# Patient Record
Sex: Female | Born: 1940
Health system: Southern US, Community
[De-identification: ages and names within clinical notes are randomized; demographics above are authoritative.]

## PROBLEM LIST (undated history)

## (undated) DIAGNOSIS — Z87442 Personal history of urinary calculi: Secondary | ICD-10-CM

## (undated) DIAGNOSIS — J189 Pneumonia, unspecified organism: Secondary | ICD-10-CM

## (undated) DIAGNOSIS — M1990 Unspecified osteoarthritis, unspecified site: Secondary | ICD-10-CM

## (undated) DIAGNOSIS — I10 Essential (primary) hypertension: Secondary | ICD-10-CM

## (undated) HISTORY — PX: BACK SURGERY: SHX140

## (undated) HISTORY — PX: BREAST BIOPSY: SHX20

## (undated) HISTORY — PX: ABDOMINAL HYSTERECTOMY: SHX81

## (undated) HISTORY — PX: COLONOSCOPY: SHX174

## (undated) HISTORY — PX: CHOLECYSTECTOMY: SHX55

## (undated) HISTORY — PX: TONSILLECTOMY: SUR1361

---

## 1998-09-13 ENCOUNTER — Inpatient Hospital Stay (HOSPITAL_COMMUNITY): Admission: EM | Admit: 1998-09-13 | Discharge: 1998-09-16 | Payer: Self-pay | Admitting: Emergency Medicine

## 1998-09-13 ENCOUNTER — Encounter: Payer: Self-pay | Admitting: Emergency Medicine

## 1998-09-15 ENCOUNTER — Encounter: Payer: Self-pay | Admitting: Internal Medicine

## 1999-09-28 ENCOUNTER — Other Ambulatory Visit: Admission: RE | Admit: 1999-09-28 | Discharge: 1999-09-28 | Payer: Self-pay | Admitting: Family Medicine

## 2000-05-22 ENCOUNTER — Encounter: Payer: Self-pay | Admitting: Family Medicine

## 2000-05-22 ENCOUNTER — Encounter: Admission: RE | Admit: 2000-05-22 | Discharge: 2000-05-22 | Payer: Self-pay | Admitting: Family Medicine

## 2000-11-04 ENCOUNTER — Encounter: Payer: Self-pay | Admitting: Emergency Medicine

## 2000-11-04 ENCOUNTER — Emergency Department (HOSPITAL_COMMUNITY): Admission: EM | Admit: 2000-11-04 | Discharge: 2000-11-05 | Payer: Self-pay | Admitting: Emergency Medicine

## 2000-11-06 ENCOUNTER — Encounter: Payer: Self-pay | Admitting: Urology

## 2000-11-06 ENCOUNTER — Ambulatory Visit (HOSPITAL_COMMUNITY): Admission: RE | Admit: 2000-11-06 | Discharge: 2000-11-06 | Payer: Self-pay | Admitting: Urology

## 2000-11-12 ENCOUNTER — Emergency Department (HOSPITAL_COMMUNITY): Admission: EM | Admit: 2000-11-12 | Discharge: 2000-11-12 | Payer: Self-pay | Admitting: Emergency Medicine

## 2000-12-12 ENCOUNTER — Encounter: Payer: Self-pay | Admitting: Urology

## 2000-12-14 ENCOUNTER — Encounter: Payer: Self-pay | Admitting: Urology

## 2000-12-14 ENCOUNTER — Ambulatory Visit (HOSPITAL_COMMUNITY): Admission: RE | Admit: 2000-12-14 | Discharge: 2000-12-14 | Payer: Self-pay | Admitting: Urology

## 2001-01-22 ENCOUNTER — Other Ambulatory Visit: Admission: RE | Admit: 2001-01-22 | Discharge: 2001-01-22 | Payer: Self-pay | Admitting: Family Medicine

## 2001-01-23 ENCOUNTER — Encounter: Payer: Self-pay | Admitting: Urology

## 2001-01-23 ENCOUNTER — Ambulatory Visit (HOSPITAL_COMMUNITY): Admission: RE | Admit: 2001-01-23 | Discharge: 2001-01-23 | Payer: Self-pay | Admitting: Urology

## 2001-02-09 ENCOUNTER — Encounter (INDEPENDENT_AMBULATORY_CARE_PROVIDER_SITE_OTHER): Payer: Self-pay

## 2001-02-09 ENCOUNTER — Other Ambulatory Visit: Admission: RE | Admit: 2001-02-09 | Discharge: 2001-02-09 | Payer: Self-pay | Admitting: Obstetrics and Gynecology

## 2001-06-13 ENCOUNTER — Encounter: Admission: RE | Admit: 2001-06-13 | Discharge: 2001-06-13 | Payer: Self-pay | Admitting: Family Medicine

## 2001-06-13 ENCOUNTER — Encounter: Payer: Self-pay | Admitting: Family Medicine

## 2001-12-02 ENCOUNTER — Encounter (INDEPENDENT_AMBULATORY_CARE_PROVIDER_SITE_OTHER): Payer: Self-pay | Admitting: Specialist

## 2001-12-02 ENCOUNTER — Inpatient Hospital Stay (HOSPITAL_COMMUNITY): Admission: EM | Admit: 2001-12-02 | Discharge: 2001-12-08 | Payer: Self-pay | Admitting: Emergency Medicine

## 2001-12-03 ENCOUNTER — Encounter: Payer: Self-pay | Admitting: Surgery

## 2001-12-05 ENCOUNTER — Encounter: Payer: Self-pay | Admitting: Surgery

## 2002-02-07 ENCOUNTER — Ambulatory Visit (HOSPITAL_COMMUNITY): Admission: RE | Admit: 2002-02-07 | Discharge: 2002-02-07 | Payer: Self-pay | Admitting: Surgery

## 2002-02-07 ENCOUNTER — Encounter: Payer: Self-pay | Admitting: Surgery

## 2002-03-05 ENCOUNTER — Other Ambulatory Visit: Admission: RE | Admit: 2002-03-05 | Discharge: 2002-03-05 | Payer: Self-pay | Admitting: Obstetrics and Gynecology

## 2002-04-24 ENCOUNTER — Encounter: Admission: RE | Admit: 2002-04-24 | Discharge: 2002-04-24 | Payer: Self-pay | Admitting: Obstetrics and Gynecology

## 2002-04-24 ENCOUNTER — Encounter: Payer: Self-pay | Admitting: Obstetrics and Gynecology

## 2003-03-11 ENCOUNTER — Other Ambulatory Visit: Admission: RE | Admit: 2003-03-11 | Discharge: 2003-03-11 | Payer: Self-pay | Admitting: Obstetrics and Gynecology

## 2003-05-01 ENCOUNTER — Encounter: Admission: RE | Admit: 2003-05-01 | Discharge: 2003-05-01 | Payer: Self-pay | Admitting: Obstetrics and Gynecology

## 2003-05-01 ENCOUNTER — Encounter: Payer: Self-pay | Admitting: Obstetrics and Gynecology

## 2003-05-28 ENCOUNTER — Encounter (INDEPENDENT_AMBULATORY_CARE_PROVIDER_SITE_OTHER): Payer: Self-pay | Admitting: Specialist

## 2003-05-28 ENCOUNTER — Ambulatory Visit (HOSPITAL_COMMUNITY): Admission: RE | Admit: 2003-05-28 | Discharge: 2003-05-28 | Payer: Self-pay | Admitting: Unknown Physician Specialty

## 2003-07-09 ENCOUNTER — Other Ambulatory Visit: Admission: RE | Admit: 2003-07-09 | Discharge: 2003-07-09 | Payer: Self-pay | Admitting: Obstetrics and Gynecology

## 2004-07-12 ENCOUNTER — Other Ambulatory Visit
Admission: RE | Admit: 2004-07-12 | Discharge: 2004-07-12 | Payer: Self-pay | Admitting: Physical Medicine & Rehabilitation

## 2004-07-30 ENCOUNTER — Encounter: Admission: RE | Admit: 2004-07-30 | Discharge: 2004-07-30 | Payer: Self-pay | Admitting: Family Medicine

## 2004-08-09 ENCOUNTER — Other Ambulatory Visit: Admission: RE | Admit: 2004-08-09 | Discharge: 2004-08-09 | Payer: Self-pay | Admitting: Obstetrics and Gynecology

## 2004-09-21 ENCOUNTER — Observation Stay (HOSPITAL_COMMUNITY): Admission: RE | Admit: 2004-09-21 | Discharge: 2004-09-22 | Payer: Self-pay | Admitting: Obstetrics and Gynecology

## 2004-09-21 ENCOUNTER — Encounter (INDEPENDENT_AMBULATORY_CARE_PROVIDER_SITE_OTHER): Payer: Self-pay | Admitting: *Deleted

## 2005-04-13 ENCOUNTER — Encounter: Admission: RE | Admit: 2005-04-13 | Discharge: 2005-04-13 | Payer: Self-pay | Admitting: *Deleted

## 2005-08-22 ENCOUNTER — Encounter: Admission: RE | Admit: 2005-08-22 | Discharge: 2005-08-22 | Payer: Self-pay | Admitting: Obstetrics and Gynecology

## 2005-08-31 ENCOUNTER — Other Ambulatory Visit: Admission: RE | Admit: 2005-08-31 | Discharge: 2005-08-31 | Payer: Self-pay | Admitting: Obstetrics and Gynecology

## 2005-09-26 ENCOUNTER — Emergency Department (HOSPITAL_COMMUNITY): Admission: EM | Admit: 2005-09-26 | Discharge: 2005-09-26 | Payer: Self-pay | Admitting: Family Medicine

## 2005-10-17 ENCOUNTER — Encounter: Admission: RE | Admit: 2005-10-17 | Discharge: 2005-10-17 | Payer: Self-pay | Admitting: Family Medicine

## 2006-08-29 ENCOUNTER — Encounter: Admission: RE | Admit: 2006-08-29 | Discharge: 2006-08-29 | Payer: Self-pay | Admitting: Family Medicine

## 2006-10-18 ENCOUNTER — Encounter: Admission: RE | Admit: 2006-10-18 | Discharge: 2006-10-18 | Payer: Self-pay | Admitting: Family Medicine

## 2007-01-16 ENCOUNTER — Ambulatory Visit (HOSPITAL_BASED_OUTPATIENT_CLINIC_OR_DEPARTMENT_OTHER): Admission: RE | Admit: 2007-01-16 | Discharge: 2007-01-16 | Payer: Self-pay | Admitting: Orthopedic Surgery

## 2007-09-07 ENCOUNTER — Encounter: Admission: RE | Admit: 2007-09-07 | Discharge: 2007-09-07 | Payer: Self-pay | Admitting: Surgical Oncology

## 2008-10-02 ENCOUNTER — Encounter: Admission: RE | Admit: 2008-10-02 | Discharge: 2008-10-02 | Payer: Self-pay | Admitting: Family Medicine

## 2009-11-02 ENCOUNTER — Encounter: Admission: RE | Admit: 2009-11-02 | Discharge: 2009-11-02 | Payer: Self-pay | Admitting: Family Medicine

## 2010-11-30 ENCOUNTER — Other Ambulatory Visit: Payer: Self-pay | Admitting: Family Medicine

## 2010-11-30 DIAGNOSIS — Z1231 Encounter for screening mammogram for malignant neoplasm of breast: Secondary | ICD-10-CM

## 2010-12-01 ENCOUNTER — Ambulatory Visit
Admission: RE | Admit: 2010-12-01 | Discharge: 2010-12-01 | Disposition: A | Discharge status: Home / Self Care | Payer: Medicare Other | Source: Ambulatory Visit | Attending: Family Medicine | Admitting: Family Medicine

## 2010-12-01 DIAGNOSIS — Z1231 Encounter for screening mammogram for malignant neoplasm of breast: Secondary | ICD-10-CM

## 2010-12-02 ENCOUNTER — Other Ambulatory Visit: Payer: Self-pay | Admitting: Family Medicine

## 2010-12-02 DIAGNOSIS — R928 Other abnormal and inconclusive findings on diagnostic imaging of breast: Secondary | ICD-10-CM

## 2010-12-07 ENCOUNTER — Ambulatory Visit
Admission: RE | Admit: 2010-12-07 | Discharge: 2010-12-07 | Disposition: A | Discharge status: Home / Self Care | Payer: Medicare Other | Source: Ambulatory Visit | Attending: Family Medicine | Admitting: Family Medicine

## 2010-12-07 DIAGNOSIS — R928 Other abnormal and inconclusive findings on diagnostic imaging of breast: Secondary | ICD-10-CM

## 2010-12-14 NOTE — Op Note (Signed)
Ashley Lamb, Ashley Lamb                 ACCOUNT NO.:  [id number]   MEDICAL RECORD NO.:  [id number]          PATIENT TYPE:  AMB   LOCATION:  DSC                          FACILITY:  [facility]   PHYSICIAN:  Robert A. Thurston Hole, M.D. DATE OF BIRTH:  06-19-1941   DATE OF PROCEDURE:  01/16/2007  DATE OF DISCHARGE:                               OPERATIVE REPORT   PREOPERATIVE DIAGNOSIS:   PREOPERATIVE DIAGNOSES:  Right knee medial and lateral meniscal tears  with chondromalacia and synovitis.   POSTOPERATIVE DIAGNOSIS:  Right knee medial and lateral meniscal tears  with chondromalacia and synovitis.   PROCEDURE PERFORMED:  1. Right knee examination under anesthesia followed by arthroscopic      medial and lateral meniscectomies.  2. Chondroplasty with partial synovectomy.   SURGEON:  Elana Alm. Thurston Hole, M.D.   ASSISTANT SURGEON:  Julien Girt, P.A.   ANESTHESIA:  Local MAC.   OPERATIVE TIME:  30 minutes.   COMPLICATIONS:  None.   INDICATIONS FOR PROCEDURE:  Ashley Lamb is a 70 year old woman who has  had 3-4 months of increasing right knee pain with exam and MRI scan  documenting meniscal tearing with chondromalacia and synovitis.  She has  failed conservative care and is now to undergo arthroscopy.   DESCRIPTION OF PROCEDURE:  Ashley Lamb was brought into the operating  room on January 16, 2007 after a knee block was placed in the holding room  by anesthesia.  She was placed on the operative table in the supine  position.  Her tie knee was examined under anesthesia.  She had full  range of motion, 1 to 2+ crepitation, and a stable ligamentous exam,  with normal patellar tracking.  The right leg was prepped using sterile  DuraPrep and drapes using sterile technique.  Originally through an  anterolateral portal, the arthroscope with the pump test was placed  through an anteromedial portal and an arthroscopic probe was placed.   On initial inspection of the medial compartment, she was  found to have  75% grade III chondromalacia which was debrided.  A medial meniscus  tear, posterior and medial horn, was 25-35% was resected back to a  stable rim.  The intercondylar notch was inspected, anterior and  posterior cruciate ligaments were normal.  The compartment was  inspected, 30-40% grade III chondromalacia which was debrided.  Lateral  meniscus tear posterolateral and anterior horn, of which 30-40% was  resected back to a stable rim.  The patellofemoral joint showed 50-75%  grade III chondromalacia on the patellar and femoral groove and this was  debrided.  The patella tracked normally.  Significant synovitis in the  medial and lateral gutters was debrided, otherwise they were free of  pathology.  After this was done, it was felt all pathology had been  satisfactorily addressed.  The instruments were removed.  The portals  were closed with 3-0 nylon sutures.  Sterile dressings were applied, and  the patient was awakened and taken to the recovery room in stable  condition.   FOLLOW-UP:  Ashley Lamb will be followed as an outpatient on Percocet and  Mobic.  Will see her back in the office in a week for sutures out and  follow-up.      Robert A. Thurston Hole, M.D.  Electronically Signed     RAW/MEDQ  D:  01/16/2007  T:  01/17/2007  Job:  [id number]

## 2010-12-17 NOTE — H&P (Signed)
Ashley Lamb, MARCHANT NO.:  [id number]   MEDICAL RECORD NO.:  [id number]          PATIENT TYPE:  INP   LOCATION:  NA                            FACILITY:  [facility]   PHYSICIAN:  Artist Pais, M.D.    DATE OF BIRTH:  Dec 16, 1940   DATE OF ADMISSION:  DATE OF DISCHARGE:                                HISTORY & PHYSICAL   DATE OF SCHEDULED SURGERY:  September 21, 2004.   CURRENT HISTORY:  The patient is a 70 year old para 3 African-American  female who is admitted for a TAH/BSO due to an enlarging fibroid. She is  menopausal, underwent a pelvic ultrasound February 02, 2001 for complaints of  longer periods and to evaluate her endometrial thickness. She was found to  have an anterior fibroid within the uterine fundus measuring 1.7 cm, and  there was also an additional fibroid measuring 5.3 x 5.0 x 4.7 cm. Most  recently on pelvic ultrasound, her fibroid measures 7.0 x 5.2 x 5.4 cm so it  appears to have increased in size and should not be doing so, given the fact  that she is menopausal. This has been a gradual increase and certainly not  one that is so rapid to suspect a sarcoma; however, has been enlarging over  the last 3 years. The patient desires to undergo a TAH/BSO. She does not  wish to continue ultrasound surveillance to assess the growth of this  fibroid and desires TAH/BSO. The patient told me at her consultation visit  she would prefer to have surgery and I explained that we could watch this  carefully but the mean fibroid size has increased which should not really  occur in a patient who is 70 years old. She adamantly would prefer to have  surgery than to continue to have this watched, which I think is reasonable.  A follow-up ultrasound performed on March 31, 2001 showed the fibroid to  measure 5.8 x 4.4 x 5.0 cm and previously, July 2002 had measured 5.3 x 5.0  x 4.7 cm. On April 30, 2003 the fundal myoma measured 6.0 x 4.5 x 5 cm  with a 5.3 cm mean, and  because 5.8 and 6.0 cm were similar numbers, at that  point I attributed this to measurement variation. However, on the most  recent fibroid measurement, the fibroid was found to measure 7.0 x 5.2 x 5.4  cm which had clearly increased in size with a mean of 5.9 and a mean of 5.3  on September 2004. Risks of surgery including anesthetic complication;  hemorrhage; infection; damage to adjacent structures including bladder,  bowel, blood vessels, or ureters discussed with the patient. She was made  aware of postoperative risks to include wound infection, urinary tract  infection, atelectasis, pneumonia, DVT which could result in stroke or  pulmonary embolism which could be life-threatening. She is also made aware  of intraoperative risks which can include pelvic hemorrhage which could be  life-threatening. She was also made aware of postoperative risks to include  vesicovaginal fistula and the etiology of how this could occur was  discussed  with her. Most recently, she was found to have benign reactive changes over  the last two Pap smears with a history of ASCUS on March 11, 2003. Follow-  up Pap smear just showed benign reactive changes. Low-risk and high-risk HPV  were not detected. However, I decided to perform a colposcopy at which time  she was found to have a positive ECC. I did discuss this case in great  detail with Dr. Terie Purser who agreed that everything was low-grade on  the [facility] but because there were polypoid fragments in the cervical canal, I  decided it would be prudent to do a LEEP cone biopsy to rule out invasive  cancer. The LEEP cone was performed and she was found to have CIN-1 in the  canal and on the LEEP specimen there is no evidence of any high-grade  lesion. Thus, it appears safe to appear with her TAH/BSO. We also have  received clearance from her primary care physician who is Dr. Lupe Carney,  [place] [facility].   OBSTETRICAL/GYNECOLOGICAL HISTORY:  The  patient has a history of a tubal  ligation.   PAST MEDICAL HISTORY:  1.  Hypertension.  2.  Hypercholesterolemia.  3.  History of a kidney stone.  4.  Cholecystitis.  5.  History of postmenopausal bleeding with a benign endometrial polyp noted      on D&C hysteroscopy.   ALLERGIES:  No known drug allergies.   CURRENT MEDICATIONS:  1.  Toprol-XL 100 mg daily.  2.  Prempro 0.625/5 mg - the patient was urged to discontinue this on      September 15, 2004.  3.  She was also told not to take any further ibuprofen and her last      ibuprofen dose was September 14, 2004 and for this reason we will obtain      a bleeding time today on September 20, 2004.  4.  She takes potassium chloride 10 mEq.  5.  Hydrochlorothiazide 25 mg daily.  6.  Norvasc 5 mg daily.   The patient denies using any herbs.   SURGERIES:  1.  Cholecystectomy.  2.  D&C.  3.  Lithotripsy.  4.  Tubal ligation.   FAMILY HISTORY:  There is no family history of colon, breast, ovarian, or  prostate cancer. The patient's mother died at 66 with hypertension, heart  disease, and diabetes. Father died at 43 of hypertension and heart disease.  She has four siblings - one of whom has hypertension and one has diabetes;  otherwise, alive and well. Three children in their 30s, all alive and well.   It should be noted that the patient originally was referred for  postmenopausal bleeding which was completely worked up.   SOCIAL HISTORY:  The patient is retired from [employer].  Tobacco:  None. Alcohol:  None.   REVIEW OF SYSTEMS:  Noncontributory except as noted above. Denies headache,  visual changes, chest pain, shortness of breath, abdominal pain, change in  bowel habits, unintentional weight loss, dysuria, urgency, frequency,  vaginal pruritus or discharge, pain or bleeding with intercourse.   PHYSICAL EXAMINATION:  GENERAL APPEARANCE:  Well-developed African-American female.  VITAL SIGNS:  Blood pressure  146/86, heart rate 72, weight 215.  HEENT:  Normal.  NECK:  Supple without thyromegaly, adenopathy, or nodules.  CHEST:  Clear to auscultation.  BREASTS:  Symmetrical without masses, nodes, nipple retraction, or nipple  discharge. Breasts are dense in the upper outer quadrants but they  are  symmetrical.  CARDIAC:  Regular rate and rhythm without extra sounds or murmurs.  ABDOMEN:  Soft, nontender.  No hepatosplenomegaly or masses.  EXTREMITIES:  No cyanosis, clubbing, or edema.  NEUROLOGIC:  Oriented x3, grossly normal.  PELVIC:  Normal external female genitalia. No visual vulvar, vaginal, or  cervical lesions. Pap smear was performed as indicated above on July 12, 2004 and found to have benign reactive changes for which the patient has  undergone a complete workup as well as a LEEP cone to rule out carcinoma in  the endocervical canal. Everything has come back as low grade. Bimanual  examination reveals the uterus to be approximately 6-8 weeks, retroverted,  and nontender, without any adnexal masses palpated. I was able to palpate  her fibroid which palpated to be approximately 7 cm.  RECTAL:  Excellent sphincter tone, confirms pelvic exam. No masses palpated.   LABORATORY DATA:  Preoperative labs pending including a CBC, bleeding time,  PT, PTT, CMET, and urinalysis.   The patient does know that she will have compression boots to decrease the  risk of a DVT and she will also be instructed in incentive spirometry.  Preoperative antibiotics were ordered. The patient does not require SBE  prophylaxis. As mentioned above, we did obtain surgical clearance from Dr.  Lupe Carney. The patient does have  a vertical, well-healed abdominal  incision which was her tubal ligation and I have made her aware that because  of this she will need to do a bowel prep because I am concerned about  possible adhesive disease. We, however, do plan to do a Pfannenstiel  incision if it appears on  evaluation that I can adequately remove the uterus  without any difficulty with a Pfannenstiel incision. Otherwise, this would  be a vertical incision. The patient expresses understanding of and  acceptance of the risks and desires to proceed. I did see her for her  preoperative visit and sent a note with her to the hospital and I did give  her instructions for bowel prep.      DC/MEDQ  D:  09/20/2004  T:  09/20/2004  Job:  [id number]

## 2010-12-17 NOTE — Op Note (Signed)
[name], [name]:  [id number]   MEDICAL RECORD NO.:  [id number]          PATIENT TYPE:  INP   LOCATION:  9399                          FACILITY:  [facility]   PHYSICIAN:  Artist Pais, M.D.    DATE OF BIRTH:  1941-02-11   DATE OF PROCEDURE:  09/21/2004  DATE OF DISCHARGE:                                 OPERATIVE REPORT   PREOPERATIVE DIAGNOSIS:  Enlarging uterine fibroid in a postmenopausal  female.   POSTOPERATIVE DIAGNOSIS:  Enlarging uterine fibroid in a postmenopausal  female.  Adherent right ovary.   PROCEDURE:  Total abdominal hysterectomy, bilateral salpingo-oophorectomy.   SURGEON:  Artist Pais, M.D.   ASSISTANT:  Bing Neighbors. Sydnee Cabal, M.D.   ANESTHESIA:  General endotracheal.   DRAINS:  Foley.   COMPLICATIONS:  None.   SPECIMENS:  Uterus, tubes, ovaries, and cervix.   ESTIMATED BLOOD LOSS:  150 mL.   FLUIDS REPLACED:  3000 mL of crystalloid. Preoperatively, the patient did  receive preoperative Cefazolin as her preoperative antibiotic.   URINE OUTPUT:  350 mL.   FINDINGS:  Fundal enlarged fibroid approximately 7 cm, normal tubes and  ovaries, however the right ovary was very adherent to the right pelvic side  wall.   DESCRIPTION OF PROCEDURE:  The patient was brought to the operating room and  identified on the operating table.  After induction of adequate general  endotracheal anesthesia, the patient was placed in the supine position and  prepped and draped in the usual sterile fashion.  An examination under  anesthesia revealed the uterus and fibroid to be approximately 12 weeks in  total size and easily able to be removed via Pfannenstiel incision which had  been previously discussed with the patient.  She had a known vertical  incision from a tubal ligation, but a Pfannenstiel incision is stronger and  therefore the decision was made to perform this via the Pfannenstiel route.  Foley catheter was placed.  After a sterile prep  and drape, a Pfannenstiel  incision was made and carried down to the fascia.  The fascia was scored in  the midline and extended bilaterally using Mayo scissors.  It was then  separated free from the underlying muscles.  The muscles were separated in  the midline down to the symphysis.  The patient was placed in Trendelenburg  and the peritoneum was entered bluntly taking care to avoid bowel or other  abdominal contents.  The peritoneal incision was extended superiorly and  then inferiorly down to the bladder edge.  The pelvic and abdominal cavities  were explored.  There was noted to be an approximately 7 cm fundal fibroid  and the uterus was noted to be freely mobile.  There were noted to be  adhesions of the bowel to the left tube and ovary.  The right ovary was  noted to be significantly adherent to the right pelvic side wall.  Subsequently the O'Connor-O'Sullivan retractor was placed.  The bladder  blade was placed.  The bowel was packed out of the operative field and the  abdominal wall retractor was placed.  Long Kellys were placed at either  angle of the uterine incision and this was elevated to the level of the skin  incision.  The right round ligament was identified, suture ligated with a  figure-of-eight suture of 0 Monocryl, and divided using cautery.  The  anterior leaf of the broad ligament was separated over to the area overlying  the internal os.  The clear space was developed.  I was unable to adequately  see the ovary, so the decision was made to go ahead and remove the uterus  and then do the salpingo-oophorectomy.  The clear space was developed and a  Heaney clamp was placed across the utero-ovarian ligament, cut, and tied  with a free tie and then a suture ligature of 0 Monocryl.  The vessels were  skeletonized on the right.  The bladder was taken down using both sharp and  blunt dissection.  In a similar fashion, the left tube was identified,  clamped with a Kelly  clamp, and ligated using a figure-of-eight suture of 0  Vicryl as Dr. Sydnee Cabal prefers 0 Vicryl sutures.  The round ligament was  divided and the anterior leaf of the broad ligament was divided to the area  overlying the internal os.  This clear space was developed.  The bowel which  was really more bowel fat adherent to the left tube and ovary were taken  down very carefully using Metzenbaum scissors taking care to avoid any  damage to the bowel fat.  Subsequently, the infundibulopelvic ligament on  the left was clamped with a curved Heaney, cut, tied with a free tie and  then a suture ligature of 0 Vicryl.  The vessels were skeletonized on the  left.  The bladder was noted to be well down using both sharp and blunt  dissection.  The uterine vessels on the right were clamped with a curved  Heaney, cut, suture ligated with a suture ligature of 0 Monocryl in a figure-  of-eight fashion.  This was accomplished on the left in a similar fashion.  Successive cardinal uterosacral ligament bites were taken using a straight  Heaney clamp, cut, and suture ligated using sutures of either 0 Monocryl or  0 Vicryl.  Subsequently a curved Heaney was placed across the left  uterosacral ligament inferior to the cervix, cut, and the vagina was  entered.  A figure-of-eight suture of 0 Vicryl was placed.  This was  accomplished on the right in a similar fashion and a 0 Monocryl figure-of-  eight suture was placed.  Subsequently, using Satinsky scissors, the cervix  was separated from the vagina.  It should be noted that the uterus and  fibroid were amputated after the first cardinal uterosacral ligament bite  was taken to allow better visualization.  Subsequently, the cuff was closed  with figure-of-eight sutures of 0 Monocryl.  Attention was subsequently  turned to the right ovary which was quite adherent to the right pelvic side  wall.  The round ligament was identified and the ureter on the left had  been previously identified.  On the right, the round ligament was identified and  the posterior leaf of the broad ligament was opened and dissected.  The  right ovary was dissected off the pelvic side wall well away from the ureter  and was thought to have been dissected in its entirety.  Subsequently after  this was mobilized, curved Heaney was placed across the infundibulopelvic  ligament, clamped, tied with a free tie and a  suture ligature of 0 Monocryl.  It should be noted that very careful dissection of the ovary from the right  pelvic side wall was performed to decrease the damage to any structures.  There was noted to be only a small bit of oozing from the peritoneal edge.  This was clamped with a right angle and tied with a free tie of 2-0  Monocryl.  The pelvis was copiously irrigated with warm Ringer's lactate and  excellent hemostasis was achieved and noted at all pedicles.  The cuff was  noted to be well closed and there was noted to be no oozing at all.  At that  point, the procedure was then terminated.  The patient was taken out of  Trendelenburg and the bowel packs as well as all of the retractors were  removed.  Needle, sponge, and instrument counts correct at that point.  Subsequently, Kelly clamps were placed on the peritoneum and the peritoneum  was closed using a simple running suture of 2-0 Monocryl.  This was somewhat  difficult as the patient's peritoneum on the right was noted to be very poor  and would easily shred just with the placement of the Kelly clamp, however,  we were able to close this in its entirety.  Prior to closing the  peritoneum, there was noted to be bowel fat adhesion to the anterior  abdominal wall in such a way that the patient could have a bowel obstruction  with a loop being entraped in this adhesion.  Thus making sure there was no  bowel trapped within the adhesion, the adhesion was clamped with two Kellys  and subsequently divided using  Metzenbaum scissors and tied with free ties.  Excellent hemostasis was assured.  This was done prior to closing of the  peritoneum.  Subsequently after the peritoneum was noted to be well closed  and containing no bowel within any of the sutures, the subfascial tissue was  irrigated copiously with warm Ringer's lactate, excellent hemostasis was  achieved, and the fascia was closed using two sutures of 0 PDS with each  suture anchored at either angle of the incision, run to the midline, and  tied.  The subcutaneous tissue was irrigated copiously with warm Ringer's  lactate after noting that the fascia was well closed.  The subcutaneous  tissue was subsequently cauterized and there was noted to be no bleeding  vessels and the skin was closed with staples.  The patient tolerated the  procedure well without apparent complications and was transferred to the  recovery room in stable condition after all needle, sponge, and instrument  counts correct.     DC/MEDQ  D:  09/21/2004  T:  09/21/2004  Job:  [id number]

## 2010-12-17 NOTE — Discharge Summary (Signed)
Ashley Lamb, [name]:  [id number]   MEDICAL RECORD NO.:  [id number]          PATIENT TYPE:  OBV   LOCATION:  9302                          FACILITY:  [facility]   PHYSICIAN:  Artist Pais, M.D.    DATE OF BIRTH:  11-03-1940   DATE OF ADMISSION:  09/21/2004  DATE OF DISCHARGE:  09/22/2004                                 DISCHARGE SUMMARY   CURRENT HISTORY:  The patient is a 70 year old para 3 African-American  female admitted for a TAH/BSO due to an enlarging fibroid.When she underwent  a pelvic ultrasound February 02, 2001 she was found to have an anterior fibroid  measuring 1.7 cm, as well as an additional fibroid measuring 5.3 x 5.0 x 4.7  cm. Most recently on pelvic ultrasound, her fibroid additional fibroid was  noted to measure 7.0 x 5.2 x 5.4 so it appeared to have been increasing in  size. Because the patient has been a puzzle with the fibroid enlarging over  the last 3 years, the decision was made to perform a TAH/BSO because the  patient desired definitive treatment and did not want to continue ultrasound  surveillance to assess this growth of the fibroid. She was also made aware  that fibroids should not be enlarging during the menopausal state. For  further details, please see the dictated History and Physical.   HOSPITAL COURSE:  The patient was admitted and subsequently underwent a  total abdominal hysterectomy and bilateral salpingo-oophorectomy without  difficulty. Postoperatively, the patient was found at her postoperative  evaluation to have a stable blood pressure with excellent urine output and  she was afebrile. Her abdomen was soft and nontender and she was receiving  adequate analgesia. She was noted to have no vaginal bleeding. The surgery  was explained to the patient with all family present and she was made aware  that her right ovary had been adherent to the right pelvic sidewall but was  mobilized very carefully. On postoperative day #1, she  was receiving  adequate analgesia, blood pressure was 118/72, and she was afebrile with a  temperature of 97.2. Her lungs were clear, abdomen was soft and nontender,  she was passing flatus. She had no CVA tenderness and no calf tenderness.  Her incision was noted to be clean, dry, and intact. Potassium was found to  be 3.2 and hemoglobin 11.9, white count was 9900. She was subsequently  advanced to a regular diet. She was able to tolerate this regular diet and  the patient desired discharge home, and she was subsequently discharged home  on postoperative day #1 - which was September 22, 2004. She was made aware  that she was somewhat hypokalemic and was urged to increase her food intake  of bananas to replete her potassium. She was urged to call Dr. Clovis Riley, her  family physician, for orders regarding restarting her antihypertensive  medications. She was given Tylox dispense #30 to take one to two q.3-4h. as  needed for pain, and to operate no heavy machinery while taking this  medication. She was also changed to Premarin 0.625  mg daily instead of  Prempro. She was given extensive discharge instructions. She was urged to  return to the office the following week to have her staples removed and to  call with any problems.   SIGNIFICANT FINDINGS:  The patient was found to have a fibroid uterus and  adherent right ovary. Specimens removed were uterus, cervix, right and left  tube, and right and left ovary. In addition, she was found to have an  adhesed right ovary to the right pelvic sidewall.   OPERATION:  TAH/BSO as dictated in the operative report.   FINAL DIAGNOSES:  As above.   SECONDARY DIAGNOSES:  1.  The patient with menopausal symptoms.  2.  History of dysplasia treated with loop electrocautery excision procedure      a few weeks before which showed no malignancy.  3.  Mild hypokalemia.   PATHOLOGY:  The pathology report revealed left ovary to have some surface  adhesions.  The left fallopian tube was noted to be a benign fallopian tube.  Right ovary showed fragments of benign ovarian parenchyma. On pathology,  fallopian tube was not grossly or microscopically identified in the right  adnexal region but this was a very difficult dissection and no additional  structures were remaining. Pathology also revealed her to have atrophic  endometrium without hyperplasia or malignancy. Myometrium showed adenomyosis  and multiple leiomyomas.   CONSULTANTS:  None.   CONDITION UPON DISCHARGE:  Stable. The patient was discharged to home with  extensive discharge instructions.      DC/MEDQ  D:  10/17/2004  T:  10/18/2004  Job:  [id number]   cc:   L. Lupe Carney, M.D.  [address] [place]  [place] [address]  Fax: [phone]

## 2010-12-17 NOTE — Op Note (Signed)
NAME:  Ashley Lamb, [name]:  [id number]   MEDICAL RECORD NO.:  [id number]                   PATIENT TYPE:  AMB   LOCATION:  SDC                                  FACILITY:  [facility]   PHYSICIAN:  Artist Pais, M.D.                 DATE OF BIRTH:  27-Jan-1941   DATE OF PROCEDURE:  05/28/2003  DATE OF DISCHARGE:                                 OPERATIVE REPORT   PREOPERATIVE DIAGNOSES:  1. Postmenopausal bleeding with polyp on sonohysterogram.  2. Fibroid uterus.   POSTOPERATIVE DIAGNOSES:  1. Postmenopausal bleeding with polyp on sonohysterogram.  2. Fibroid uterus.   PROCEDURE:  Dilatation and curettage hysteroscopy and polypectomy.   SURGEON:  Artist Pais, M.D., Ph.D.   ANESTHESIA:  MAC plus 20 mL of 1% lidocaine, paracervical block.   FLUIDS:  900 mL of crystalloid.   ESTIMATED BLOOD LOSS:  Minimal.   COMPLICATIONS:  None.   DRAINS:  None.   DESCRIPTION OF OPERATION:  The patient was brought to the operating room and  identified on the operating room table.  After the patient was adequately  sedated, she was placed in the dorsal lithotomy position and prepped and  draped in the usual sterile fashion.  The bladder was straight cathed for a  few mL of clear yellow urine.  Bimanual examination revealed the uterus to  be retroverted without any adnexal masses palpated.  She does have a known  fibroid uterus.  The speculum was placed, and the posterior lip of the  cervix was infiltrated with 1 mL of 1% lidocaine and grasped with a single  tooth tenaculum.  The remaining 19 mL of 1% lidocaine were infused for a  paracervical block.  The cervix was then very gently and carefully dilated  up to a #25 Pratt dilator.  The uterus sounded to approximately 8.5 cm.  After careful dilatation, I placed the [facility] hysteroscope using Sorbitol as  the distending medium.  However, due to the curve from the retroverted  uterus, I was unable to advance this  without doing it under direct vision,  and I advanced this slowly under direct vision.  I was able to place the  scope to the fundus; however, even though we turned up the pressure on the  pump for infusion of the Sorbitol, the fluid could not distend in the  endometrial cavity due to the patient's fibroids.  At that point, the scope  was withdrawn, and using the serrated curette, a careful and thorough  curettage was performed.  Several polypoid fragments were obtained.  Subsequently I placed the Randall stone forceps and was able to remove  additional tissue.  Then, sharp curettage using the serrated curette was  again performed until a good cry was heard all around. At that point, the  procedure was then terminated.  I did attempt to place the scope once again  but was unable to have the fluid distend the endometrial cavity.  At that  point, the procedure was terminated.  The patient tolerated the procedure  well without apparent complications and was transferred to the recovery room  in stable condition after all instrument, sponge, and  needle counts were correct.  She was given Toradol intraoperatively and also  given the postop instruction sheet.  Encouraged to take ibuprofen or her  Ultram prescription as prescribed and to return to the office in two to  three weeks.  She will call in the meantime if she has any problems.                                               Artist Pais, M.D.    DC/MEDQ  D:  05/28/2003  T:  05/28/2003  Job:  [id number]   cc:   [facility]   L. Lupe Carney, M.D.  [address] [place]  [place] [address]  Fax: [phone]

## 2010-12-17 NOTE — H&P (Signed)
[facility]  Patient:    Ashley Lamb, Ashley Lamb Visit Number: [id number] MRN: [id number]          Service Type: MED Location: [phone] Attending Physician:  Bonnetta Barry Dictated by:   Velora Heckler, M.D. Admit Date:  12/02/2001   CC:         Abran Cantor. Clovis Riley, M.D.  Beather Arbour Thomasena Edis, M.D.   History and Physical  REFERRING PHYSICIAN:  Hilario Quarry, M.D., [facility] Emergency Department.  REASON FOR ADMISSION:  Abdominal pain, acute cholecystitis, acute pancreatitis.  HISTORY OF PRESENT ILLNESS:  The patient is a 71 year old black female, developed abdominal pain on the evening of Nov 29, 2001.  This was epigastric in nature, associated with nausea and vomiting.  It persisted through the night and the patient was completely relieved of her symptoms by the morning of May 2.  However, late in the evening on May 2 the pain, nausea, and vomiting recurred.  On the morning of May 3 the patient presented to urgent medical care, where she was evaluated.  She had a normal white count.  She was started on oral Levaquin and pain medication and sent home.  The patient had persistent pain and nausea.  She returned to urgent care on the morning of admission, Dec 02, 2001, and was sent to the emergency department at [facility] for surgical evaluation.  In the emergency department the patient was noted to have abdominal pain and tenderness.  Abnormal liver function tests were noted on laboratory studies.  CT scan of the abdomen and pelvis were obtained and showed findings consistent with acute cholecystitis. General surgery was consulted.  PAST MEDICAL HISTORY:  Status post bilateral tubal ligation, status post lumbar disk surgery, history of hypertension.  MEDICATIONS:  Hydrochlorothiazide 25 mg p.o. q.d., Toprol unknown dose q.d., Norvasc unknown dose q.d., potassium chloride unknown dose q.d., estradiol.  CARE PROVIDERS:  Primary care  physician, Abran Cantor. Clovis Riley, M.D. Gynecologist, Beather Arbour. Thomasena Edis, M.D.  ALLERGIES:  No known drug allergies.  SOCIAL HISTORY:  The patient is accompanied by multiple family members.  She is retired from [employer].  She has a history of tobacco use but quit cigarettes 10 years ago.  She denies alcohol use.  REVIEW OF SYSTEMS:  Fifteen-system review without significant other positive except as noted above.  FAMILY HISTORY:  Unremarkable with no adverse reactions to anesthesia.  PHYSICAL EXAMINATION:  VITAL SIGNS:  Temperature 97.2, pulse 76, respirations 16, blood pressure 139/80.  O2 saturation 98% on room air.  GENERAL:  A 70 year old well-developed, well-nourished black female on a stretcher in the emergency department, in mild discomfort.  HEENT:  Normocephalic.  Sclerae are clear.  Dentition is good.  Mucous membranes are dry.  Voice is normal.  NECK:  Palpation of the neck shows no masses.  There is no thyroid nodularity. There is no tenderness.  CHEST:  Lungs are clear to auscultation bilaterally in all lung fields.  There are no crackles, no rales, and no rhonchi.  CARDIAC:  A regular rate and rhythm without murmur.  Peripheral pulses are full at the wrist, groin, and dorsalis pedis.  ABDOMEN:  Soft with mild distention.  There are bowel sounds present.  There are well-healed surgical wounds in the lower midline.  There is tenderness to palpation and percussion, particularly in the epigastrium and right upper quadrant.  There is voluntary guarding.  There is a suggestion of a right upper quadrant mass.  There is  an umbilical hernia, which is incarcerated.  EXTREMITIES:  Nontender without edema.  NEUROLOGIC:  The patient is alert and oriented to person, place, and time, without focal neurologic deficit.  LABORATORY DATA:  Complete blood count shows white count 10.4, hemoglobin 14.4, hematocrit 41.3%, platelet count 283,000.  Differential shows 80% neutrophils, 8%  lymphocytes, 11% monocytes, 1% eosinophils.  Coagulation studies show a PT of 13.2 with an INR of 1.0, PTT is normal at 30 seconds. Chemistry profile shows the following abnormalities:  Potassium 2.6, glucose 122, SGOT 82, SGPT 98, alkaline phosphatase is normal at 101, total bilirubin is elevated at 2.6.  Amylase is elevated at 1294, lipase is elevated at 1596. Urine pregnancy test is negative.  Urinalysis is benign.  Chest x-ray shows no active disease.  CT scan of the abdomen and pelvis with findings as noted above, with wall thickening of the gallbladder consistent with acute cholecystitis, no gallstones were seen, no biliary dilatation was identified, and no obvious abnormality of the pancreas was identified.  IMPRESSION: 1. Acute, possibly acalculous, cholecystitis. 2. Acute pancreatitis. 3. Hypokalemia. 4. Hypertension. 5. Incarcerated umbilical hernia.  PLAN: 1. Admit to [facility]. 2. Maintain NPO and begin intravenous hydration. 3. Replace potassium losses with potassium chloride to correct hypokalemia. 4. Ultrasound of the abdomen in a.m. Dec 03, 2001, to evaluate for possible    cholelithiasis. 5. Repeat laboratory studies, including CBC, complete metabolic profile,    amylase, and lipase in a.m. Dec 03, 2001. 6. Likely cholecystectomy during this admission. 7. Gastroenterology aware of the patient and on stand-by.  If clinical course    fails to improve, we will consider ERCP. Dictated by:   Velora Heckler, M.D. Attending Physician:  Bonnetta Barry DD:  12/02/01 TD:  12/04/01 Job: [id number]

## 2010-12-17 NOTE — Op Note (Signed)
[facility]  Patient:    Ashley Lamb, Ashley Lamb Visit Number: [id number] MRN: [id number]          Service Type: MED Location: [phone] Attending Physician:  Bonnetta Barry Dictated by:   Velora Heckler, M.D. Proc. Date: 12/05/01 Admit Date:  12/02/2001   CC:         Melvyn Neth D. Clovis Riley, M.D.  Beather Arbour Thomasena Edis, M.D.   Operative Report  PREOPERATIVE DIAGNOSES:  Acute acalculous cholecystitis, acute pancreatitis.  POSTOPERATIVE DIAGNOSES:  1. Acute cholecystitis.  2. Cholelithiasis.  3. Acute pancreatitis.  PROCEDURES:  1. Attempted laparoscopic cholecystectomy.  2. Intraoperative cholangiography.  3. Open cholecystectomy.  4. Repair common bile duct.  SURGEON:  Velora Heckler, M.D.  ASSISTANT:  Gita Kudo, M.D.  ANESTHESIA:  General.  ESTIMATED BLOOD LOSS:  100 cc.  PREPARATION:  Betadine.  COMPLICATIONS:  None.  INDICATIONS FOR PROCEDURE:  The patient is a 70 year old black female who presented to the emergency department with a three day history of abdominal pain. Evaluation showed elevated amylase and lipase. CT scan of the abdomen showed findings consistent with acute acalculous cholecystitis. The patient was admitted on the general surgery service and treated with intravenous antibiotics, IV fluids and bowel rest. The following day, ultrasound of the abdomen was obtained which showed a thick walled gallbladder without gallstones. There was on biliary dilatation. Liver function tests returned to normal levels as did amylase and lipase. The patient was allowed a clear liquid diet on the second hospital day. She was then prepared and brought to the operating room on the morning of May 7 for cholecystectomy.  DESCRIPTION OF PROCEDURE:  The procedure was done in OR #1 at the [facility]. The patient was brought to the operating room and placed in supine position in the operating room table. Following the  administration of general anesthesia, the patient was prepped and draped in the usual strict aseptic fashion. After ascertaining that an adequate level of anesthesia had been obtained, an infraumbilical incision is made with a #10 blade. Dissection is carried down through the subcutaneous tissues. There is an umbilical hernia defect present. This was dissected out sharply with the Metzenbaum scissors. The hernia is reduced. The peritoneal cavity is easily entered. An #0 Vicryl pursestring suture is placed in the defect. A Hasson cannula is introduced and secured with the pursestring suture. The abdomen is insufflated with carbon dioxide. The laparoscope was introduced under direct vision and the abdomen explored. There is a tense distended thick walled gallbladder in the right upper quadrant. There are omental adhesions to the gallbladder which are bluntly taken down. Operative ports are placed along the right costal margin in the midline, mid clavicular line, and anterior axillary line. The fundus of the gallbladder is grasped and retracted cephalad.  Dissection is begun down near the neck of the gallbladder. The peritoneum is incised. There is a large inflammatory lymph node overlying the infundibulum of the gallbladder. This is mobilized with hemostasis obtained with the electrocautery. However, upon mobilization of the lymph node and due to the dense inflammatory reaction and edematous tissue surrounding the lymph node, a small amount of golden bile was identified leaking from behind the lymph node. The lymph node was further mobilized providing exposure. There was an obvious injury to the cystic duct with leakage of clear bile from the end near the common bile duct. There was near complete transection of the cystic duct. Dissection of the proximal end  of the cystic duct going towards the gallbladder was performed. This was clearly a cystic duct injury and not an injury to the common  hepatic duct or common bile duct. The cystic artery was identified, doubly clipped, and divided. The infundibulum of the gallbladder was then mobilized from the liver bed. Next a Reddick cholangiography catheter was introduced through an angiocath through the abdominal wall to the right upper quadrant. The Reddick catheter was inserted through the distal defect in the cystic duct and into the common bile duct. The balloon was inflated. Using C-arm fluoroscopy, real-time cholangiography was performed. There was free flow of contrast distally into the duodenum. With the balloon deflated, there was free flow of contrast proximally into the right and left ductal systems and primary and secondary biliary radicles. The cholangiography catheter was withdrawn. It was clear that the cystic duct had been quite short in length and that the avulsed cystic duct was within a few millimeters of the common bile duct. A decision was made at this point to convert to open procedure. The laparoscopic ports were removed. The umbilical hernia defect was closed with interrupted #0 Novofil sutures. A right subcostal incision was made with a #10 blade and extended with the electrocautery to the fascia. The fascia was divided. The rectus muscle was divided. The peritoneal cavity was entered cautiously. A Balfour retractor was placed for exposure. Dissection was begun at the fundus of the gallbladder. The peritoneum was incised with the electrocautery and the gallbladder was carefully dissected out of the gallbladder bed. The gallbladder was largely intrahepatic with dense inflammatory reaction and edema throughout the gallbladder wall. Dissection was carried distally. There appeared to be a duct of Luschka entering the posterior wall of the gallbladder. This was secured with a LigaClip. It was then divided sharply. The dissection was carried down to the neck of the gallbladder. No further  ductile structures were  encountered. No major vascular structures were encountered. The entire gallbladder was excised. On inspection, the transected cystic duct was present at the infundibulum. There was a very short cystic duct segment of only a few millimeters. Upon opening the gallbladder, there were numerous 1-2 mm gallstones throughout the gallbladder, too numerous to count. Next the right upper quadrant was irrigated with warm saline. Exposure of the common bile duct was performed. Peritoneum overlying the common bile duct was opened proximally and distally to the defect. The defect measured approximately 1.5 mm in size. It was probed with a blunt probe and it appeared that the defect went distally into the common bile duct. It appeared that the distal cystic duct and the common bile duct shared a common wall. The 1.5 mm defect was then closed with interrupted 4-0 Vicryl sutures. These were 4 in number. There appeared to be good closure without compromise of the lumen of either the common hepatic duct or common bile duct. The right upper quadrant was copiously irrigated and no further bile leak could be identified. Good hemostasis was obtained. Surgicel was placed into the gallbladder bed. A #10 Jackson-Pratt drain was placed in the vicinity of the common bile duct repair. It was brought out through one of the lateral port stab wounds and secured to the skin with a 3-0 nylon suture. Packs were removed. The right upper quadrant was again irrigated and good hemostasis was noted. The abdominal wall was closed in two layers with running #1 PDS sutures. The subcutaneous tissues were irrigated and hemostasis obtained with the electrocautery. The skin of all  wounds was closed with stainless steel staples. Sterile dressings were applied. The patient was awakened from anesthesia and brought to the recovery room in stable condition. The patient tolerated the procedure well. Dictated by:   Velora Heckler,  M.D. Attending Physician:  Bonnetta Barry DD:  12/05/01 TD:  12/05/01 Job: [id number]

## 2010-12-17 NOTE — H&P (Signed)
NAME:  Ashley Lamb, FALLAS NO.:  [id number]   MEDICAL RECORD NO.:  [id number]                   PATIENT TYPE:  AMB   LOCATION:  SDC                                  FACILITY:  [facility]   PHYSICIAN:  Artist Pais, M.D.                 DATE OF BIRTH:  [birth year]   DATE OF ADMISSION:  05/28/2003  DATE OF DISCHARGE:                                HISTORY & PHYSICAL   HISTORY OF PRESENT ILLNESS:  The patient is a 70 year old African-American  female para 3 who presented complaining of difficulty sleeping after the  pharmacy substituted her 1 mg of Estrace for Estradiol.  She had been taking  FEM HRT and had also added 1 mg of Estrace because of continued complaints  of hot flashes.  It was my opinion that adding a small amount of Estrace  with the extreme progesterone dominance of FEM HRT would definitely protect  her endometrial lining.  However, I did decide to go ahead with a pelvic  ultrasound after she had been taking the Estrace for approximately one year  to check her endometrial lining.  I did think that her lining felt well  protected due to the progesterone dominance of FEM HRT.  At time of her  pelvic ultrasound, she was found to have an echogenic focus in the  endometrium on ultrasound which was performed April 01, 2003.  Subsequently, the patient presented complaining of postmenopausal bleeding  and underwent a sonohysterogram. She was found to have a submucous myoma 6 x  5 mm and an echogenic focus on the anterior wall 8 x 5 mm thought to be a  possible polyp.  She is advised to undergo D&C, hysteroscopy and  polypectomy.  Risks of surgery including anesthetic complication,  hemorrhage, infection, damage to adjacent structures including bladder,  bowel, blood vessels, ureter were discussed with the patient.  She was made  aware of the risk of uterine perforation which could result in life-  threatening hemorrhage requiring emergent hysterectomy and  uterine  perforation which could result in bowel damage requiring emergent colostomy  or which could result in overwhelming life-threatening peritonitis.  She  expressed understanding of and acceptance of these risks and desires to  proceed with surgery.   OB/GYN HISTORY:  Menarche age 89.  Contraception:  Tubal ligation, but the  patient is menopausal and as of August 10 had had no postmenopausal  bleeding.  I even saw her April 09, 2003 at which point she did not  complain of any postmenopausal bleeding, but subsequently had bleeding after  that time until the time of her sonohysterogram May 01, 2003.   PAST MEDICAL HISTORY:  Hypertension, hypercholesterolemia, kidney stone, and  cholecystitis.   ALLERGIES:  No known drug allergies.   CURRENT MEDICATIONS:  1. Estrace 1 mg daily.  2. FEM HRT.  3. Norvasc.  4. Toprol.  5.  HCTZ.   SURGERIES:  Lithotripsy and open cholecystectomy, Dec 02, 2001.   FAMILY HISTORY:  There is no family history of colon, breast, ovarian or  prostate cancer.  The patient's mother died at 80 of hypertension, heart  disease and diabetes.  Her father died at 68 of hypertension and heart  disease.  She has one brother with diabetes, sister with hypertension and  two sisters ages 17 and 34 alive and well.  She has three children, all  alive and well.   SOCIAL HISTORY:  The patient retired from __________ .  She is married.  Does not use alcohol or tobacco.   REVIEW OF SYSTEMS:  Noncontributory except as noted above.  Denies headache,  visual changes, chest pain, shortness of breath, abdominal pain, change in  bowel habits, unintentional weight loss, dysuria, urgency, frequency,  vaginal pruritus or discharge.   PHYSICAL EXAMINATION:  GENERAL:  Well-developed African-American female.  VITAL SIGNS:  Blood pressure 110/80, heart rate 84, weight 205.  HEENT:  Normal.  NECK:  Supple without adenopathy, thyromegaly or nodules.  CHEST:  Clear to  auscultation.  BREASTS:  Symmetrical without masses, no dimpling, retraction, or nipple  discharge.  CARDIAC:  Regular  rate and rhythm without extra sounds or murmurs.  ABDOMEN:  Soft, nontender.  No hepatosplenomegaly or masses.  EXTREMITIES:  No clubbing, cyanosis or edema.  NEUROLOGIC:  Oriented x3.  Grossly normal.  PELVIC:  Normal external female genitalia.  No vulvar, vaginal or cervical  lesions.  Pap smear performed March 11, 2003 showed slight squamous atypia  and patient is scheduled for repeat Pap smear at the three month mark.  Bimanual examination reveals the uterus to be small, mobile and nontender  without any adnexal mass.  Palpated uterus is retroverted.  RECTAL:  Excellent sphincter tone  __________ exam no masses palpated.   ASSESSMENT/PLAN:  The patient is a 69 year old African-American female who  underwent a pelvic ultrasound just because I had given her some added back  estrogen therapy but she is also on a very progesterone-dominant hormone  replacement therapy.  This was just to check her endometrial lining.  She  was found to have a possible polyp.  Subsequently underwent a  sonohysterogram which indicates a possible polyp.  Admitted for D&C,  hysteroscopy and polypectomy.  Risks of surgery have been explained.  She  expresses understanding of and acceptance of these risks.  This could well  just be a small fibroid as well. We have contacted Dr. Clovis Riley who has  given clearance for surgery.  All the patient's questions were answered.                                                Artist Pais, M.D.    DC/MEDQ  D:  05/28/2003  T:  05/28/2003  Job:  [id number]   cc:   L. Lupe Carney, M.D.  [address]  [place] [address]  Fax: [phone]   Preoperative Area  [facility]

## 2010-12-17 NOTE — Discharge Summary (Signed)
[facility]  Patient:    Ashley Lamb, Ashley Lamb Visit Number: [id number] MRN: [id number]          Service Type: MED Location: [phone] Attending Physician:  Bonnetta Barry Dictated by:   Velora Heckler, M.D. Admit Date:  12/02/2001 Discharge Date: 12/08/2001                             Discharge Summary  REASON FOR ADMISSION:  Acute cholecystitis.  HISTORY OF PRESENT ILLNESS:  Patient is a 70 year old black female who presented with a three-day history of upper abdominal pain, nausea, and vomiting.  She was seen initially at Urgent Care.  She was placed on Levaquin. The patient returned with increased pain and was seen at the [facility] emergency department.  She was noted to have elevated liver function tests, elevated serum lipase and amylase levels.  CT scan of the abdomen showed a thick-walled, dilated gallbladder.  Patient was seen and admitted on the general surgical service.  Patient had gradual resolution of her enzyme abnormalities.  She was maintained on empiric intravenous antibiotics for presumed acute cholecystitis.  Patient was prepared and taken to the operating room on May 7 where she underwent an attempted laparoscopic cholecystectomy. She was converted to open cholecystectomy, intraoperative cholangiogram, and repair of common bile duct.  Postoperatively, the patient did well.  Her ileus resolved.  Her diet was advanced.  Jackson-Pratt drain remained in place postoperatively without evidence of bile leak.  She was prepared for discharge home on the third postoperative day.  DISCHARGE PLAN:  Patient is discharged home on Dec 08, 2001, in good condition, tolerating a regular diet and ambulating independently.  Patients discharge medications are Vicodin as needed for pain and Augmentin 875 mg twice daily for five days.  The patient will be seen back in my office at [facility] in 10 days for a wound  check.  FINAL DIAGNOSES:  Acute cholecystitis, cholelithiasis, acute biliary pancreatitis.  CONDITION AT DISCHARGE:  Improved. Dictated by:   Velora Heckler, M.D. Attending Physician:  Bonnetta Barry DD:  01/10/02 TD:  01/12/02 Job: [id number]

## 2011-01-28 ENCOUNTER — Ambulatory Visit (HOSPITAL_COMMUNITY)
Admission: RE | Admit: 2011-01-28 | Discharge: 2011-01-28 | Disposition: A | Discharge status: Home / Self Care | Payer: Medicare Other | Source: Ambulatory Visit | Attending: Orthopedic Surgery | Admitting: Orthopedic Surgery

## 2011-01-28 ENCOUNTER — Encounter (HOSPITAL_COMMUNITY)
Admission: RE | Admit: 2011-01-28 | Discharge: 2011-01-28 | Disposition: A | Discharge status: Home / Self Care | Payer: Medicare Other | Source: Ambulatory Visit | Attending: Orthopedic Surgery | Admitting: Orthopedic Surgery

## 2011-01-28 ENCOUNTER — Other Ambulatory Visit (HOSPITAL_COMMUNITY): Payer: Self-pay | Admitting: Orthopedic Surgery

## 2011-01-28 DIAGNOSIS — R059 Cough, unspecified: Secondary | ICD-10-CM | POA: Insufficient documentation

## 2011-01-28 DIAGNOSIS — M79604 Pain in right leg: Secondary | ICD-10-CM

## 2011-01-28 DIAGNOSIS — R05 Cough: Secondary | ICD-10-CM | POA: Insufficient documentation

## 2011-01-28 DIAGNOSIS — Z01810 Encounter for preprocedural cardiovascular examination: Secondary | ICD-10-CM | POA: Insufficient documentation

## 2011-01-28 DIAGNOSIS — M79609 Pain in unspecified limb: Secondary | ICD-10-CM | POA: Insufficient documentation

## 2011-01-28 DIAGNOSIS — Z01812 Encounter for preprocedural laboratory examination: Secondary | ICD-10-CM | POA: Insufficient documentation

## 2011-01-28 DIAGNOSIS — Z01818 Encounter for other preprocedural examination: Secondary | ICD-10-CM | POA: Insufficient documentation

## 2011-01-28 LAB — CBC
MCH: 29.7 pg (ref 26.0–34.0)
MCHC: 33.5 g/dL (ref 30.0–36.0)
Platelets: 361 10*3/uL (ref 150–400)
RBC: 4.31 MIL/uL (ref 3.87–5.11)

## 2011-01-28 LAB — URINALYSIS, ROUTINE W REFLEX MICROSCOPIC
Ketones, ur: NEGATIVE mg/dL
Leukocytes, UA: NEGATIVE
Nitrite: NEGATIVE
Protein, ur: NEGATIVE mg/dL
pH: 6.5 (ref 5.0–8.0)

## 2011-01-28 LAB — TYPE AND SCREEN
ABO/RH(D): A POS
Antibody Screen: NEGATIVE

## 2011-01-28 LAB — COMPREHENSIVE METABOLIC PANEL
CO2: 27 mEq/L (ref 19–32)
Calcium: 9.3 mg/dL (ref 8.4–10.5)
Creatinine, Ser: 0.71 mg/dL (ref 0.50–1.10)
GFR calc Af Amer: >60 mL/min (ref >60)
GFR calc non Af Amer: >60 mL/min (ref >60)
Glucose, Bld: 108 mg/dL — ABNORMAL HIGH (ref 70–99)

## 2011-02-03 ENCOUNTER — Inpatient Hospital Stay (HOSPITAL_COMMUNITY): Payer: Medicare Other

## 2011-02-03 ENCOUNTER — Inpatient Hospital Stay (HOSPITAL_COMMUNITY)
Admission: RE | Admit: 2011-02-03 | Discharge: 2011-02-05 | DRG: 460 | Disposition: A | Discharge status: Home Health | Payer: Medicare Other | Source: Ambulatory Visit | Attending: Orthopedic Surgery | Admitting: Orthopedic Surgery

## 2011-02-03 DIAGNOSIS — Z87891 Personal history of nicotine dependence: Secondary | ICD-10-CM

## 2011-02-03 DIAGNOSIS — G8929 Other chronic pain: Secondary | ICD-10-CM | POA: Diagnosis present

## 2011-02-03 DIAGNOSIS — Q762 Congenital spondylolisthesis: Secondary | ICD-10-CM

## 2011-02-03 DIAGNOSIS — I10 Essential (primary) hypertension: Secondary | ICD-10-CM | POA: Diagnosis present

## 2011-02-03 DIAGNOSIS — E669 Obesity, unspecified: Secondary | ICD-10-CM | POA: Diagnosis present

## 2011-02-03 DIAGNOSIS — M48061 Spinal stenosis, lumbar region without neurogenic claudication: Principal | ICD-10-CM | POA: Diagnosis present

## 2011-02-07 NOTE — Op Note (Signed)
Ashley Lamb, [name]:  [id number]  MEDICAL RECORD NO.:  [id number]  LOCATION:  5025                         FACILITY:  [facility]  PHYSICIAN:  Estill Bamberg, MD      DATE OF BIRTH:  1940-08-15  DATE OF PROCEDURE:  02/03/2011 DATE OF DISCHARGE:                              OPERATIVE REPORT   PREOPERATIVE DIAGNOSES: 1. Moderate to severe L3-4 spinal stenosis, status post a previous     posterior decompression from L4-S1. 2. Grade 1 L3-4 spondylolisthesis.  POSTOPERATIVE DIAGNOSES: 1. Moderate to severe L3-4 spinal stenosis, status post a previous     posterior decompression from L4-S1. 2. Grade 1 L3-4 spondylolisthesis.  PROCEDURES: 1. L3-4 laminectomy, partial facetectomy and foraminotomy. 2. Placement of posterior instrumentation at L3-4. 3. Use of local autograft. 4. Posterolateral fusion at L3-4.  SURGEON:  Estill Bamberg, MD  ASSISTANT:  Skip Mayer, [facility]  ANESTHESIA:  General endotracheal anesthesia.  COMPLICATIONS:  None.  DISPOSITION:  Stable.  ESTIMATED BLOOD LOSS:  400 mL.  FINDINGS:  Severe profound L3-4 spinal stenosis with significant scarring and adhesions noted in the lateral recess at the L3-4 level on the right side.  INDICATIONS FOR PROCEDURE:  Briefly, Ashley Lamb is a very pleasant 70- year-old female who presented to my office with a 1-year history of pain in her bilateral legs and distribution of the L4 nerves bilaterally.  We did go forward with extensive conservative treatment including epidural injections.  The patient did get temporary improvement from epidural injections but she did go on complain ongoing pain in the bilateral legs which she did rate as severe.  Given her failure of nonoperative care, we did have a discussion regarding going forward with an L3-4 decompression and instrumented posterolateral fusion using local autograft.  The patient fully understood the risks and limitations of the procedure as  outlined in my preoperative note.  OPERATIVE DETAILS:  On February 03, 2011, the patient was brought to surgery and general endotracheal anesthesia was administered.  The patient was placed in prone on a well-padded Jackson spinal frame.  A time-out procedure was performed and SCDs were placed.  Antibiotics were given. I made an incision over the L3-4 level extending cephalad from the patient's scar from her previous procedure.  I did readily identify the L3 and L4 lamina and a lateral intraoperative radiograph was obtained in order to confirm the appropriate level in the trajectory of the pedicle screws.  I then fully exposed the posterolateral gutters, exposed the transverse processes of L3 and L4 and the posterolateral gutters were packed.  I then went forward with the decompressive aspect of the procedure.  I used a series of Kerrisons in order to meticulously and carefully go forward with decompression at the L3-4 interspace.  Again should be noted that there was extensive in severe stenosis and this did taken [place] significant amount of time in meticulous care to ensure that this was done safely.  I did do a thorough decompression out to the pedicles of L3 and L4 and at the termination of the decompression, I was easily able to pass a [facility] out the neural foramina at the L3- 4  levels on both the right and the left sides.  It should be noted that there was extensive scar formation noted in the lateral recess at the L3- 4 level which did appear to be residual scar from the patient's previous procedure.  I did take meticulous amount of time to safely free up the adhesions from the dura and the traversing L4 nerve, but this was ultimately done safely.  There was a moderate degree of manipulation of the L4 nerve given the extensive scar tissue but again at the termination of the decompression, I was easily able to pass a [facility] elevator out the L3-4 neural foramina and lateral to the  L4 pedicle with no undue compression identified.  I then packed the epidural space with FloSeal and patties.  I then turned my attention towards hardware placement.  I then cannulated the L3-L4 pedicles using a 4-mm high-speed bur followed by a gearshift probe.  I did use a ball-tip probe to confirm that there was no cortical violation.  Then I used a 6-mm tap to cannulate the pedicles.  This was done at L3 and L4.  I then placed bone wax over the cannulated pedicles and I copiously irrigated the wound with approximately 2 L of normal saline.  I then placed the autograft obtained from the decompression into the posterolateral gutters on the right and left sides.  Then I placed 7 x 40 mm screws bilaterally at the L3 and L4 levels.  A 35-mm rod was placed bilaterally.  Of note, I did test each of the pedicle screws using motor evoked EMG.  The right-sided L3 and L4 screws tested at 54 and at 58 mA respectively.  The left-sided L3 and L4 screws tested 60 and 52 mA respectively.  I then placed a 35- mm rod and the rod was locked into position by performing a final locking maneuver.  At this point, I turned my attention towards epidural bleeding and the epidural bleeding was meticulously controlled using bipolar electrocautery in addition of FloSeal and patties.  I then closed the fascia using #1 Vicryl.  The subcutaneous layer was closed using 2-0 Vicryl and the skin was closed using 3-0 nylon.  All instrument counts were correct at the termination of the procedure.  Of note, I did obtain a second lateral intraoperative radiograph after the hardware was finally tightened and I did note appropriate positioning of the hardware.  All instrument counts were correct at the termination of the procedure.  Of note, Skip Mayer, PA-C was my assistant throughout the procedure and aided in essential retraction suctioning required throughout the procedure.     Estill Bamberg, MD     MD/MEDQ   D:  02/03/2011  T:  02/04/2011  Job:  [id number]  Electronically Signed by Estill Bamberg  on 02/07/2011 09:30:38 AM

## 2011-03-23 NOTE — Discharge Summary (Signed)
  [name], [name] NO.:  [id number]  MEDICAL RECORD NO.:  [id number]  LOCATION:  5025                         FACILITY:  [facility]  PHYSICIAN:  Estill Bamberg, MD      DATE OF BIRTH:  April 02, 1941  DATE OF ADMISSION:  02/03/2011 DATE OF DISCHARGE:  02/05/2011                              DISCHARGE SUMMARY   ADMISSION DIAGNOSIS:  Severe L2-4 spinal stenosis.  DISCHARGE DIAGNOSIS:  L2-4 spinal stenosis, status post L3-4 decompression and fusion.  ADMISSION HISTORY:  Briefly, Ms. Ashley Lamb is a pleasant 70 year old female who presented to my office with a 1-year history of pain in her bilateral legs.  We did go forward with a conservative care and ultimately decide to go forward with an L3-4 decompression and fusion based on her lack of improvement with conservative care.  The patient was, therefore, admitted on February 03, 2011, for the procedure noted above.  HOSPITAL COURSE:  On February 03, 2011, the patient brought to Surgery and underwent an L3-4 decompression and fusion.  The patient tolerated the procedure well, was transferred to recovery in stable condition.  The patient was given a PCA pump overnight and the drain was placed.  The patient was evaluated by me in the morning of postoperative day #1, at which point, she was noted to be neurovascularly intact.  Her drain output was minimal and her drain was discontinued.  She was progressively mobilized throughout her hospital stay with the Physical Therapy Team and on the morning of postoperative day #2, it was felt that the patient will be safe for discharge home.  DISCHARGE INSTRUCTIONS:  The patient will take Percocet for pain and Valium for spasms.  She will follow up in my office in approximately 2 weeks after her procedure.  She does understand to contact my office should any signs or symptoms of infection to clear themselves.     Estill Bamberg, MD     MD/MEDQ  D:  03/08/2011  T:  03/09/2011  Job:   [id number]  Electronically Signed by Estill Bamberg  on 03/23/2011 12:22:30 PM

## 2011-05-19 LAB — BASIC METABOLIC PANEL
CO2: 26
GFR calc non Af Amer: 58 — ABNORMAL LOW
Glucose, Bld: 73
Potassium: 3.8
Sodium: 142

## 2011-08-05 DIAGNOSIS — M549 Dorsalgia, unspecified: Secondary | ICD-10-CM | POA: Diagnosis not present

## 2011-08-05 DIAGNOSIS — M545 Low back pain: Secondary | ICD-10-CM | POA: Diagnosis not present

## 2011-08-05 DIAGNOSIS — M48061 Spinal stenosis, lumbar region without neurogenic claudication: Secondary | ICD-10-CM | POA: Diagnosis not present

## 2011-09-08 DIAGNOSIS — H40009 Preglaucoma, unspecified, unspecified eye: Secondary | ICD-10-CM | POA: Diagnosis not present

## 2011-09-08 DIAGNOSIS — H5200 Hypermetropia, unspecified eye: Secondary | ICD-10-CM | POA: Diagnosis not present

## 2011-09-08 DIAGNOSIS — H25019 Cortical age-related cataract, unspecified eye: Secondary | ICD-10-CM | POA: Diagnosis not present

## 2011-09-08 DIAGNOSIS — H524 Presbyopia: Secondary | ICD-10-CM | POA: Diagnosis not present

## 2011-10-14 DIAGNOSIS — M545 Low back pain: Secondary | ICD-10-CM | POA: Diagnosis not present

## 2011-11-25 ENCOUNTER — Other Ambulatory Visit: Payer: Self-pay | Admitting: Family Medicine

## 2011-11-25 DIAGNOSIS — Z1231 Encounter for screening mammogram for malignant neoplasm of breast: Secondary | ICD-10-CM

## 2011-11-28 DIAGNOSIS — Z79899 Other long term (current) drug therapy: Secondary | ICD-10-CM | POA: Diagnosis not present

## 2011-11-28 DIAGNOSIS — E7800 Pure hypercholesterolemia, unspecified: Secondary | ICD-10-CM | POA: Diagnosis not present

## 2011-11-28 DIAGNOSIS — I10 Essential (primary) hypertension: Secondary | ICD-10-CM | POA: Diagnosis not present

## 2011-11-28 DIAGNOSIS — M159 Polyosteoarthritis, unspecified: Secondary | ICD-10-CM | POA: Diagnosis not present

## 2011-11-28 DIAGNOSIS — N951 Menopausal and female climacteric states: Secondary | ICD-10-CM | POA: Diagnosis not present

## 2011-11-28 DIAGNOSIS — E559 Vitamin D deficiency, unspecified: Secondary | ICD-10-CM | POA: Diagnosis not present

## 2011-12-02 ENCOUNTER — Ambulatory Visit
Admission: RE | Admit: 2011-12-02 | Discharge: 2011-12-02 | Disposition: A | Discharge status: Home / Self Care | Payer: Medicare Other | Source: Ambulatory Visit | Attending: Family Medicine | Admitting: Family Medicine

## 2011-12-02 DIAGNOSIS — Z1231 Encounter for screening mammogram for malignant neoplasm of breast: Secondary | ICD-10-CM | POA: Diagnosis not present

## 2011-12-12 DIAGNOSIS — H40009 Preglaucoma, unspecified, unspecified eye: Secondary | ICD-10-CM | POA: Diagnosis not present

## 2012-04-09 DIAGNOSIS — M545 Low back pain: Secondary | ICD-10-CM | POA: Diagnosis not present

## 2012-04-16 DIAGNOSIS — M545 Low back pain: Secondary | ICD-10-CM | POA: Diagnosis not present

## 2012-04-20 DIAGNOSIS — M545 Low back pain: Secondary | ICD-10-CM | POA: Diagnosis not present

## 2012-04-24 DIAGNOSIS — M545 Low back pain: Secondary | ICD-10-CM | POA: Diagnosis not present

## 2012-04-27 DIAGNOSIS — M545 Low back pain: Secondary | ICD-10-CM | POA: Diagnosis not present

## 2012-05-01 DIAGNOSIS — M545 Low back pain: Secondary | ICD-10-CM | POA: Diagnosis not present

## 2012-05-04 DIAGNOSIS — M545 Low back pain: Secondary | ICD-10-CM | POA: Diagnosis not present

## 2012-05-08 DIAGNOSIS — IMO0002 Reserved for concepts with insufficient information to code with codable children: Secondary | ICD-10-CM | POA: Diagnosis not present

## 2012-05-15 DIAGNOSIS — M47817 Spondylosis without myelopathy or radiculopathy, lumbosacral region: Secondary | ICD-10-CM | POA: Diagnosis not present

## 2012-05-21 DIAGNOSIS — M533 Sacrococcygeal disorders, not elsewhere classified: Secondary | ICD-10-CM | POA: Diagnosis not present

## 2012-05-21 DIAGNOSIS — Z23 Encounter for immunization: Secondary | ICD-10-CM | POA: Diagnosis not present

## 2012-05-23 DIAGNOSIS — M533 Sacrococcygeal disorders, not elsewhere classified: Secondary | ICD-10-CM | POA: Diagnosis not present

## 2012-05-28 DIAGNOSIS — I10 Essential (primary) hypertension: Secondary | ICD-10-CM | POA: Diagnosis not present

## 2012-05-28 DIAGNOSIS — N951 Menopausal and female climacteric states: Secondary | ICD-10-CM | POA: Diagnosis not present

## 2012-05-28 DIAGNOSIS — Z0000 Encounter for general adult medical examination without abnormal findings: Secondary | ICD-10-CM | POA: Diagnosis not present

## 2012-05-28 DIAGNOSIS — M159 Polyosteoarthritis, unspecified: Secondary | ICD-10-CM | POA: Diagnosis not present

## 2012-05-28 DIAGNOSIS — Z79899 Other long term (current) drug therapy: Secondary | ICD-10-CM | POA: Diagnosis not present

## 2012-06-07 DIAGNOSIS — M533 Sacrococcygeal disorders, not elsewhere classified: Secondary | ICD-10-CM | POA: Diagnosis not present

## 2012-06-22 DIAGNOSIS — M533 Sacrococcygeal disorders, not elsewhere classified: Secondary | ICD-10-CM | POA: Diagnosis not present

## 2012-07-19 DIAGNOSIS — N951 Menopausal and female climacteric states: Secondary | ICD-10-CM | POA: Diagnosis not present

## 2012-08-31 DIAGNOSIS — M1710 Unilateral primary osteoarthritis, unspecified knee: Secondary | ICD-10-CM | POA: Diagnosis not present

## 2012-08-31 DIAGNOSIS — M25569 Pain in unspecified knee: Secondary | ICD-10-CM | POA: Diagnosis not present

## 2012-10-12 DIAGNOSIS — M961 Postlaminectomy syndrome, not elsewhere classified: Secondary | ICD-10-CM | POA: Diagnosis not present

## 2012-10-17 DIAGNOSIS — M533 Sacrococcygeal disorders, not elsewhere classified: Secondary | ICD-10-CM | POA: Diagnosis not present

## 2012-10-25 DIAGNOSIS — M533 Sacrococcygeal disorders, not elsewhere classified: Secondary | ICD-10-CM | POA: Diagnosis not present

## 2012-10-25 DIAGNOSIS — M47817 Spondylosis without myelopathy or radiculopathy, lumbosacral region: Secondary | ICD-10-CM | POA: Diagnosis not present

## 2012-11-06 DIAGNOSIS — M533 Sacrococcygeal disorders, not elsewhere classified: Secondary | ICD-10-CM | POA: Diagnosis not present

## 2012-11-06 DIAGNOSIS — M47817 Spondylosis without myelopathy or radiculopathy, lumbosacral region: Secondary | ICD-10-CM | POA: Diagnosis not present

## 2012-11-19 DIAGNOSIS — M533 Sacrococcygeal disorders, not elsewhere classified: Secondary | ICD-10-CM | POA: Diagnosis not present

## 2012-11-27 DIAGNOSIS — I10 Essential (primary) hypertension: Secondary | ICD-10-CM | POA: Diagnosis not present

## 2012-11-27 DIAGNOSIS — E7800 Pure hypercholesterolemia, unspecified: Secondary | ICD-10-CM | POA: Diagnosis not present

## 2012-11-27 DIAGNOSIS — N951 Menopausal and female climacteric states: Secondary | ICD-10-CM | POA: Diagnosis not present

## 2012-11-27 DIAGNOSIS — Z79899 Other long term (current) drug therapy: Secondary | ICD-10-CM | POA: Diagnosis not present

## 2012-11-27 DIAGNOSIS — M159 Polyosteoarthritis, unspecified: Secondary | ICD-10-CM | POA: Diagnosis not present

## 2012-11-27 DIAGNOSIS — E559 Vitamin D deficiency, unspecified: Secondary | ICD-10-CM | POA: Diagnosis not present

## 2012-11-29 DIAGNOSIS — M47817 Spondylosis without myelopathy or radiculopathy, lumbosacral region: Secondary | ICD-10-CM | POA: Diagnosis not present

## 2012-12-13 DIAGNOSIS — M533 Sacrococcygeal disorders, not elsewhere classified: Secondary | ICD-10-CM | POA: Diagnosis not present

## 2012-12-13 DIAGNOSIS — M47817 Spondylosis without myelopathy or radiculopathy, lumbosacral region: Secondary | ICD-10-CM | POA: Diagnosis not present

## 2013-01-07 DIAGNOSIS — M533 Sacrococcygeal disorders, not elsewhere classified: Secondary | ICD-10-CM | POA: Diagnosis not present

## 2013-01-10 ENCOUNTER — Other Ambulatory Visit: Payer: Self-pay

## 2013-01-10 DIAGNOSIS — Z1231 Encounter for screening mammogram for malignant neoplasm of breast: Secondary | ICD-10-CM

## 2013-02-11 ENCOUNTER — Ambulatory Visit
Admission: RE | Admit: 2013-02-11 | Discharge: 2013-02-11 | Disposition: A | Discharge status: Home / Self Care | Payer: Medicare Other | Source: Ambulatory Visit

## 2013-02-11 DIAGNOSIS — Z1231 Encounter for screening mammogram for malignant neoplasm of breast: Secondary | ICD-10-CM

## 2013-02-22 ENCOUNTER — Emergency Department (HOSPITAL_COMMUNITY): Payer: Medicare Other

## 2013-02-22 ENCOUNTER — Encounter (HOSPITAL_COMMUNITY): Payer: Self-pay | Admitting: *Deleted

## 2013-02-22 ENCOUNTER — Emergency Department (HOSPITAL_COMMUNITY)
Admission: EM | Admit: 2013-02-22 | Discharge: 2013-02-22 | Disposition: A | Discharge status: Home / Self Care | Payer: Medicare Other | Attending: Emergency Medicine | Admitting: Emergency Medicine

## 2013-02-22 DIAGNOSIS — Y9389 Activity, other specified: Secondary | ICD-10-CM | POA: Insufficient documentation

## 2013-02-22 DIAGNOSIS — S91309A Unspecified open wound, unspecified foot, initial encounter: Secondary | ICD-10-CM | POA: Diagnosis not present

## 2013-02-22 DIAGNOSIS — W208XXA Other cause of strike by thrown, projected or falling object, initial encounter: Secondary | ICD-10-CM | POA: Insufficient documentation

## 2013-02-22 DIAGNOSIS — S91109A Unspecified open wound of unspecified toe(s) without damage to nail, initial encounter: Secondary | ICD-10-CM | POA: Diagnosis not present

## 2013-02-22 DIAGNOSIS — R269 Unspecified abnormalities of gait and mobility: Secondary | ICD-10-CM | POA: Diagnosis not present

## 2013-02-22 DIAGNOSIS — Y929 Unspecified place or not applicable: Secondary | ICD-10-CM | POA: Insufficient documentation

## 2013-02-22 DIAGNOSIS — Z79899 Other long term (current) drug therapy: Secondary | ICD-10-CM | POA: Diagnosis not present

## 2013-02-22 DIAGNOSIS — IMO0002 Reserved for concepts with insufficient information to code with codable children: Secondary | ICD-10-CM

## 2013-02-22 NOTE — ED Provider Notes (Signed)
CSN: [id number]     Arrival date & time 02/22/13  1336 History  This chart was scribed for non-physician practitioner working with Suzi Roots, MD, by Ardelia Mems ED Scribe. This patient was seen in room TR05C/TR05C and the patient's care was started at 4:07 PM.   First MD Initiated Contact with Patient 02/22/13 1514     Chief Complaint  Patient presents with  . Foot Injury   The history is provided by the patient. No language interpreter was used.   HPI Comments: Ashley Lamb is a 72 y.o. female who presents to the Emergency Department complaining of a 1 cm superficial  laceration to her left great toe onset while shopping after a can fell from a shelf onto her toe. She states that has constant pain which has gradually improved from moderate to mild. She states that she is not on any daily blood thinners. She states that she is otherwise healthy with no chronic medical conditions. She is ambulatory. She states that her Tetanus vaccinations are UTD. She denies pain in any of her other toes.  She denies fever, chills, nausea, vomiting or any other symptoms.   History reviewed. No pertinent past medical history.  History reviewed. No pertinent past surgical history.  History reviewed. No pertinent family history.  History  Substance Use Topics  . Smoking status: Not on file  . Smokeless tobacco: Not on file  . Alcohol Use: No   OB History   Grav Para Term Preterm Abortions TAB SAB Ect Mult Living                 Review of Systems  Constitutional: Negative for fever and chills.  Gastrointestinal: Negative for nausea and vomiting.  Musculoskeletal: Positive for gait problem.  Skin: Positive for wound.  All other systems reviewed and are negative.   Allergies  Review of patient's allergies indicates no known allergies.  Home Medications   Current Outpatient Rx  Name  Route  Sig  Dispense  Refill  . amLODipine (NORVASC) 5 MG tablet   Oral   Take 5 mg by mouth  daily.         . Cholecalciferol (VITAMIN D) 2000 UNITS CAPS   Oral   Take 2,000 Units by mouth daily.         Marland Kitchen estradiol (ESTRACE) 1 MG tablet   Oral   Take 1 mg by mouth daily.         Marland Kitchen gabapentin (NEURONTIN) 100 MG capsule   Oral   Take 100 mg by mouth 3 (three) times daily.         . hydrochlorothiazide (HYDRODIURIL) 25 MG tablet   Oral   Take 25 mg by mouth daily.         . metoprolol succinate (TOPROL-XL) 100 MG 24 hr tablet   Oral   Take 100 mg by mouth daily. Take with or immediately following a meal.         . nabumetone (RELAFEN) 750 MG tablet   Oral   Take 750 mg by mouth 2 (two) times daily.         . potassium chloride (K-DUR,KLOR-CON) 10 MEQ tablet   Oral   Take 10 mEq by mouth 2 (two) times daily.          Triage Vitals: BP 125/76  Pulse 77  Temp(Src) 97.5 F (36.4 C) (Oral)  Resp 18  SpO2 100%  Physical Exam  Nursing note and vitals reviewed. Constitutional:  She is oriented to person, place, and time. She appears well-developed and well-nourished. No distress.  HENT:  Head: Normocephalic and atraumatic.  Right Ear: External ear normal.  Left Ear: External ear normal.  Nose: Nose normal.  Mouth/Throat: Oropharynx is clear and moist.  Eyes: Conjunctivae are normal.  Neck: Normal range of motion.  Cardiovascular: Normal rate, regular rhythm and normal heart sounds.   Pulmonary/Chest: Effort normal and breath sounds normal. No stridor. No respiratory distress. She has no wheezes. She has no rales.  Abdominal: Soft. She exhibits no distension.  Musculoskeletal: Normal range of motion.  Full ROM of all toes. Superficial 1 cm laceration to right fifth toe. No evidence of foreign bodies in fourth toe.  Neurological: She is alert and oriented to person, place, and time. She has normal strength.  Skin: Skin is warm and dry. She is not diaphoretic. No erythema.  Psychiatric: She has a normal mood and affect. Her behavior is normal.     ED Course   Procedures (including critical care time)  DIAGNOSTIC STUDIES: Oxygen Saturation is 100% on RA, normal by my interpretation.    COORDINATION OF CARE: 4:13 PM- Pt advised of plan for treatment with dermabond, along with plan for diagnostic radiology and pt agrees.  LACERATION REPAIR Performed by: Junious Silk Authorized by: Junious Silk Consent: Verbal consent obtained. Risks and benefits: risks, benefits and alternatives were discussed Consent given by: patient Patient identity confirmed: provided demographic data Prepped and Draped in normal sterile fashion Wound explored  Laceration Location: right great toe  Laceration Length: 1 cm  No Foreign Bodies seen or palpated  Anesthesia: none  Irrigation method: syringe Amount of cleaning: standard  Skin closure: dermabond  Technique: 4 thin layers  Patient tolerance: Patient tolerated the procedure well with no immediate complications.   Labs Reviewed - No data to display  Dg Foot Complete Right  02/22/2013   *RADIOLOGY REPORT*  Clinical Data: Foot injury today with great toe laceration.  RIGHT FOOT COMPLETE - 3+ VIEW  Comparison: None.  Findings: The mineralization and alignment are normal.  There is no evidence of acute fracture or dislocation.  There are mild degenerative changes at the first metatarsal phalangeal joint.  No focal soft tissue abnormalities are seen within the great toe. However, there is suspicion of a linear foreign body plantar to the fourth proximal phalanx, best seen on the AP and oblique views.  IMPRESSION:  1.  No acute great toe findings demonstrated. 2.  Suspicion of linear foreign body in the fourth toe.   Original Report Authenticated By: Carey Bullocks, M.D.   1. Laceration     MDM  Tdap UTD. Wound cleaning complete with pressure irrigation, bottom of wound visualized, no foreign bodies appreciated. Laceration occurred < 8 hours prior to repair which was well tolerated.  Pt has no co morbidities to effect normal wound healing. Laceration amenable to dermabond. Discussed home care instructions. Pt is hemodynamically stable w no complaints prior to dc.       I personally performed the services described in this documentation, which was scribed in my presence. The recorded information has been reviewed and is accurate.     Mora Bellman, PA-C 02/22/13 1643

## 2013-02-22 NOTE — ED Notes (Signed)
Reports being at store and reaching for can of beans and had multiple cans fall on her feet, has small laceration to right toe. Ambulatory at triage.

## 2013-02-22 NOTE — ED Notes (Addendum)
Patient states that she was at the store and she had cans fall on her R great toe.   Patient states "it bled for a while, but has tapered off now".    Bleeding is currently controlled.  Patient alert and oriented.  NAD.

## 2013-02-26 NOTE — ED Provider Notes (Signed)
Medical screening examination/treatment/procedure(s) were performed by non-physician practitioner and as supervising physician I was immediately available for consultation/collaboration.   Scotlynn Noyes E Evangelia Whitaker, MD 02/26/13 0829 

## 2013-03-22 ENCOUNTER — Other Ambulatory Visit: Payer: Self-pay | Admitting: Family Medicine

## 2013-03-22 ENCOUNTER — Ambulatory Visit
Admission: RE | Admit: 2013-03-22 | Discharge: 2013-03-22 | Disposition: A | Discharge status: Home / Self Care | Payer: Medicare Other | Source: Ambulatory Visit | Attending: Family Medicine | Admitting: Family Medicine

## 2013-03-22 DIAGNOSIS — R29898 Other symptoms and signs involving the musculoskeletal system: Secondary | ICD-10-CM

## 2013-03-22 DIAGNOSIS — G459 Transient cerebral ischemic attack, unspecified: Secondary | ICD-10-CM

## 2013-03-22 DIAGNOSIS — I10 Essential (primary) hypertension: Secondary | ICD-10-CM | POA: Diagnosis not present

## 2013-03-25 ENCOUNTER — Ambulatory Visit
Admission: RE | Admit: 2013-03-25 | Discharge: 2013-03-25 | Disposition: A | Discharge status: Home / Self Care | Payer: Medicare Other | Source: Ambulatory Visit | Attending: Family Medicine | Admitting: Family Medicine

## 2013-03-25 DIAGNOSIS — G459 Transient cerebral ischemic attack, unspecified: Secondary | ICD-10-CM

## 2013-03-25 DIAGNOSIS — I658 Occlusion and stenosis of other precerebral arteries: Secondary | ICD-10-CM | POA: Diagnosis not present

## 2013-04-05 ENCOUNTER — Other Ambulatory Visit: Payer: Self-pay | Admitting: Family Medicine

## 2013-04-05 DIAGNOSIS — E041 Nontoxic single thyroid nodule: Secondary | ICD-10-CM

## 2013-04-05 DIAGNOSIS — R609 Edema, unspecified: Secondary | ICD-10-CM | POA: Diagnosis not present

## 2013-04-07 DIAGNOSIS — R609 Edema, unspecified: Secondary | ICD-10-CM | POA: Diagnosis not present

## 2013-04-08 DIAGNOSIS — M533 Sacrococcygeal disorders, not elsewhere classified: Secondary | ICD-10-CM | POA: Diagnosis not present

## 2013-04-16 ENCOUNTER — Ambulatory Visit
Admission: RE | Admit: 2013-04-16 | Discharge: 2013-04-16 | Disposition: A | Discharge status: Home / Self Care | Payer: Medicare Other | Source: Ambulatory Visit | Attending: Family Medicine | Admitting: Family Medicine

## 2013-04-16 ENCOUNTER — Other Ambulatory Visit (HOSPITAL_COMMUNITY)
Admission: RE | Admit: 2013-04-16 | Discharge: 2013-04-16 | Disposition: A | Discharge status: Home / Self Care | Payer: Medicare Other | Source: Ambulatory Visit | Attending: Interventional Radiology | Admitting: Interventional Radiology

## 2013-04-16 DIAGNOSIS — E041 Nontoxic single thyroid nodule: Secondary | ICD-10-CM | POA: Diagnosis not present

## 2013-04-16 DIAGNOSIS — E0789 Other specified disorders of thyroid: Secondary | ICD-10-CM | POA: Diagnosis not present

## 2013-04-16 DIAGNOSIS — D449 Neoplasm of uncertain behavior of unspecified endocrine gland: Secondary | ICD-10-CM | POA: Diagnosis not present

## 2013-04-24 DIAGNOSIS — D497 Neoplasm of unspecified behavior of endocrine glands and other parts of nervous system: Secondary | ICD-10-CM | POA: Diagnosis not present

## 2013-05-16 ENCOUNTER — Encounter (HOSPITAL_COMMUNITY): Payer: Self-pay | Admitting: *Deleted

## 2013-05-16 ENCOUNTER — Encounter (HOSPITAL_COMMUNITY): Payer: Self-pay

## 2013-05-16 MED ORDER — CEFAZOLIN SODIUM-DEXTROSE 2-3 GM-% IV SOLR
2.0000 g | INTRAVENOUS | Status: AC
  Administered 2013-05-17: 2 g via INTRAVENOUS
  Filled 2013-05-16: qty 50

## 2013-05-16 NOTE — Progress Notes (Signed)
Pt denies SOB, chest pain, and being under the care of a cardiologist. Pt denies having any cardiac studies done or a chest x ray and EKG within the last year. Pt made aware to stop  taking Aspirin and herbal medications. Do not take any NSAIDs ie: Ibuprofen, Advil, Naproxen or any medication containing Aspirin.

## 2013-05-16 NOTE — H&P (Signed)
Assessment  Neoplasm of thyroid (239.7) (D44.0). 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 We discussed there is a 20% chance this is cancerous. She is very interested in surgical excision for definitive diagnostic purposes. We discussed the nature of the surgery, the need for overnight stay, the need for frozen section, possible total thyroidectomy either at the same sitting or possibly later on. We discussed the risks to the recurrent nerve and the parathyroids. All questions were answered. Reason For Visit  Ashley Lamb is here today at the kind request of none for consultation and opinion for thyroid nodules. HPI  Right thyroid mass incidentally identified on neck ultrasound. FNA reveals follicular epithelium. No symptoms related to this. She recently had the ultrasound of the carotids because of some right sided numbness but that has since resolved and no cause was never identified. She is otherwise in very good health. Allergies  No Known Drug Allergies. Current Meds  Hydrochlorothiazide 25 MG Oral Tablet;; RPT Metoprolol Tartrate 50 MG Oral Tablet;; RPT Estradiol 1 MG Oral Tablet;; RPT Aspirin EC 81 MG Oral Tablet Delayed Release;; RPT Potassium Chloride ER 10 MEQ Oral Capsule Extended Release;; RPT Vitamin D CAPS;; RPT AmLODIPine Besylate 5 MG Oral Tablet;; RPT. PSH  Back Surgery Cholecystectomy Hysterectomy. Family Hx  Family history of cardiac disorder: Mother,Father (V17.49) (Z82.49). Personal Hx  Never smoker (V49.89) (Z78.9) No alcohol use No caffeine use Non-smoker (V49.89) (Z78.9). ROS  Systemic: Not feeling tired (fatigue).  No fever, no night sweats, and no recent weight loss. Head: No headache. Eyes: No eye symptoms. Otolaryngeal: No hearing loss, no earache, no tinnitus, and no purulent nasal discharge.  No nasal passage blockage (stuffiness), no snoring, no sneezing, no hoarseness, and no sore throat. Cardiovascular: No chest pain or discomfort  and no  palpitations. Pulmonary: No dyspnea, no cough, and no wheezing. Gastrointestinal: No dysphagia  and no heartburn.  No nausea, no abdominal pain, and no melena.  No diarrhea. Genitourinary: No dysuria. Endocrine: No muscle weakness. Musculoskeletal: No calf muscle cramps, no arthralgias, and no soft tissue swelling. Neurological: No dizziness, no fainting, no tingling, and no numbness. Psychological: No anxiety  and no depression. Skin: No rash. 12 system ROS was obtained and reviewed on the Health Maintenance form dated today.  Positive responses are shown above.  If the symptom is not checked, the patient has denied it. Vital Signs   Recorded by Skolimowski,Sharon on 24 Apr 2013 09:26 AM BP:130/80,  Height: 69 in, Weight: 206 lb, BMI: 30.4 kg/m2,  BMI Calculated: 30.42 ,  BSA Calculated: 2.09. Physical Exam  APPEARANCE: Well developed, well nourished, in no acute distress.  Normal affect, in a pleasant mood.  Oriented to time, place and person. COMMUNICATION: Normal voice   HEAD & FACE:  No scars, lesions or masses of head and face.  Sinuses nontender to palpation.  Salivary glands without mass or tenderness.  Facial strength symmetric.  No facial lesion, scars, or mass. EYES: EOMI with normal primary gaze alignment. Visual acuity grossly intact.  PERRLA EXTERNAL EAR & NOSE: No scars, lesions or masses  EAC & TYMPANIC MEMBRANE:  EAC shows no obstructing lesions or debris and tympanic membranes are normal bilaterally with good movement to insufflation. GROSS HEARING: Normal  TMJ:  Nontender  INTRANASAL EXAM: No polyps or purulence.  NASOPHARYNX: Normal, without lesions. LIPS, TEETH & GUMS: No lip lesions, upper and lower dentures and normal gums. ORAL CAVITY/OROPHARYNX:  Oral mucosa moist without lesion or asymmetry of the palate, tongue,  tonsil or posterior pharynx. LARYNX (mirror exam):  No lesions of the epiglottis, false cord or TVC's and cords move well to phonation. HYPOPHARYNX  (mirror exam): No lesions, asymmetry or pooling of secretions. NECK:  Supple without adenopathy or mass. THYROID:  Normal with no masses palpable. There is a fullness anteriorly on the right but am unable to palpate a specific mass. It may be behind the clavicle. NEUROLOGIC:  No gross CN deficits. No nystagmus noted.   LYMPHATIC:  No enlarged nodes palpable. Signature  Electronically signed by : Serena Colonel  M.D.; 04/24/2013 9:38 AM EST.

## 2013-05-17 ENCOUNTER — Encounter (HOSPITAL_COMMUNITY): Payer: Medicare Other | Admitting: Anesthesiology

## 2013-05-17 ENCOUNTER — Observation Stay (HOSPITAL_COMMUNITY)
Admission: RE | Admit: 2013-05-17 | Discharge: 2013-05-18 | Disposition: A | Discharge status: Home / Self Care | Payer: Medicare Other | Source: Ambulatory Visit | Attending: Otolaryngology | Admitting: Otolaryngology

## 2013-05-17 ENCOUNTER — Ambulatory Visit (HOSPITAL_COMMUNITY): Payer: Medicare Other | Admitting: Anesthesiology

## 2013-05-17 ENCOUNTER — Encounter (HOSPITAL_COMMUNITY)
Admission: RE | Disposition: A | Discharge status: Home / Self Care | Payer: Self-pay | Source: Ambulatory Visit | Attending: Otolaryngology

## 2013-05-17 ENCOUNTER — Ambulatory Visit (HOSPITAL_COMMUNITY): Payer: Medicare Other

## 2013-05-17 ENCOUNTER — Encounter (HOSPITAL_COMMUNITY): Payer: Self-pay | Admitting: Surgery

## 2013-05-17 DIAGNOSIS — D449 Neoplasm of uncertain behavior of unspecified endocrine gland: Secondary | ICD-10-CM | POA: Diagnosis not present

## 2013-05-17 DIAGNOSIS — I10 Essential (primary) hypertension: Secondary | ICD-10-CM | POA: Insufficient documentation

## 2013-05-17 DIAGNOSIS — N289 Disorder of kidney and ureter, unspecified: Secondary | ICD-10-CM | POA: Insufficient documentation

## 2013-05-17 DIAGNOSIS — D497 Neoplasm of unspecified behavior of endocrine glands and other parts of nervous system: Secondary | ICD-10-CM | POA: Diagnosis not present

## 2013-05-17 DIAGNOSIS — E079 Disorder of thyroid, unspecified: Secondary | ICD-10-CM

## 2013-05-17 DIAGNOSIS — Z01818 Encounter for other preprocedural examination: Secondary | ICD-10-CM | POA: Diagnosis not present

## 2013-05-17 HISTORY — PX: THYROIDECTOMY: SHX17

## 2013-05-17 HISTORY — DX: Unspecified osteoarthritis, unspecified site: M19.90

## 2013-05-17 HISTORY — DX: Pneumonia, unspecified organism: J18.9

## 2013-05-17 HISTORY — DX: Essential (primary) hypertension: I10

## 2013-05-17 LAB — BASIC METABOLIC PANEL
BUN: 19 mg/dL (ref 6–23)
CO2: 24 mEq/L (ref 19–32)
Calcium: 9.1 mg/dL (ref 8.4–10.5)
Creatinine, Ser: 0.77 mg/dL (ref 0.50–1.10)
GFR calc Af Amer: >90 mL/min (ref >90)
GFR calc non Af Amer: 83 mL/min — ABNORMAL LOW (ref >90)
Glucose, Bld: 98 mg/dL (ref 70–99)
Potassium: 3.8 mEq/L (ref 3.5–5.1)

## 2013-05-17 LAB — CBC
Hemoglobin: 12.5 g/dL (ref 12.0–15.0)
MCH: 30.3 pg (ref 26.0–34.0)
MCHC: 34.5 g/dL (ref 30.0–36.0)
MCV: 87.9 fL (ref 78.0–100.0)
Platelets: 184 10*3/uL (ref 150–400)
RBC: 4.12 MIL/uL (ref 3.87–5.11)

## 2013-05-17 SURGERY — THYROIDECTOMY
Anesthesia: General | Site: Neck | Laterality: Right | Wound class: Clean

## 2013-05-17 MED ORDER — POTASSIUM CHLORIDE CRYS ER 10 MEQ PO TBCR
10.0000 meq | EXTENDED_RELEASE_TABLET | Freq: Two times a day (BID) | ORAL | Status: DC
  Administered 2013-05-17 – 2013-05-18 (×3): 10 meq via ORAL
  Filled 2013-05-17 (×4): qty 1

## 2013-05-17 MED ORDER — NEOSTIGMINE METHYLSULFATE 1 MG/ML IJ SOLN
INTRAMUSCULAR | Status: DC | PRN
  Administered 2013-05-17: 3 mg via INTRAVENOUS

## 2013-05-17 MED ORDER — 0.9 % SODIUM CHLORIDE (POUR BTL) OPTIME
TOPICAL | Status: DC | PRN
  Administered 2013-05-17: 1000 mL

## 2013-05-17 MED ORDER — LACTATED RINGERS IV SOLN
INTRAVENOUS | Status: DC | PRN
  Administered 2013-05-17 (×2): via INTRAVENOUS

## 2013-05-17 MED ORDER — METOPROLOL TARTRATE 50 MG PO TABS
50.0000 mg | ORAL_TABLET | Freq: Two times a day (BID) | ORAL | Status: DC
  Administered 2013-05-17 – 2013-05-18 (×2): 50 mg via ORAL
  Filled 2013-05-17: qty 2
  Filled 2013-05-17 (×2): qty 1

## 2013-05-17 MED ORDER — AMLODIPINE BESYLATE 5 MG PO TABS
5.0000 mg | ORAL_TABLET | Freq: Every day | ORAL | Status: DC
  Administered 2013-05-18: 5 mg via ORAL
  Filled 2013-05-17: qty 1

## 2013-05-17 MED ORDER — BACITRACIN ZINC 500 UNIT/GM EX OINT
TOPICAL_OINTMENT | CUTANEOUS | Status: AC
  Filled 2013-05-17: qty 15

## 2013-05-17 MED ORDER — GLYCOPYRROLATE 0.2 MG/ML IJ SOLN
INTRAMUSCULAR | Status: DC | PRN
  Administered 2013-05-17: 0.4 mg via INTRAVENOUS

## 2013-05-17 MED ORDER — FENTANYL CITRATE 0.05 MG/ML IJ SOLN
INTRAMUSCULAR | Status: DC | PRN
  Administered 2013-05-17: 100 ug via INTRAVENOUS
  Administered 2013-05-17: 50 ug via INTRAVENOUS
  Administered 2013-05-17: 100 ug via INTRAVENOUS

## 2013-05-17 MED ORDER — HYDROCODONE-ACETAMINOPHEN 5-325 MG PO TABS
1.0000 | ORAL_TABLET | ORAL | Status: DC | PRN
  Administered 2013-05-17 – 2013-05-18 (×4): 1 via ORAL
  Filled 2013-05-17 (×4): qty 1

## 2013-05-17 MED ORDER — PROPOFOL 10 MG/ML IV BOLUS
INTRAVENOUS | Status: DC | PRN
  Administered 2013-05-17: 150 mg via INTRAVENOUS
  Administered 2013-05-17 (×2): 50 mg via INTRAVENOUS

## 2013-05-17 MED ORDER — HYDROMORPHONE HCL PF 1 MG/ML IJ SOLN
INTRAMUSCULAR | Status: AC
  Filled 2013-05-17: qty 1

## 2013-05-17 MED ORDER — ROCURONIUM BROMIDE 100 MG/10ML IV SOLN
INTRAVENOUS | Status: DC | PRN
  Administered 2013-05-17 (×2): 20 mg via INTRAVENOUS
  Administered 2013-05-17: 10 mg via INTRAVENOUS

## 2013-05-17 MED ORDER — HYDROCHLOROTHIAZIDE 25 MG PO TABS
12.5000 mg | ORAL_TABLET | Freq: Every day | ORAL | Status: DC
  Administered 2013-05-17: 12.5 mg via ORAL
  Filled 2013-05-17: qty 1
  Filled 2013-05-17: qty 0.5

## 2013-05-17 MED ORDER — IBUPROFEN 200 MG PO TABS: 200.0000 mg | ORAL_TABLET | Freq: Two times a day (BID) | ORAL | Status: DC | PRN

## 2013-05-17 MED ORDER — DEXTROSE-NACL 5-0.9 % IV SOLN
INTRAVENOUS | Status: DC
  Administered 2013-05-17 – 2013-05-18 (×2): via INTRAVENOUS

## 2013-05-17 MED ORDER — PROMETHAZINE HCL 25 MG RE SUPP: 25.0000 mg | Freq: Four times a day (QID) | RECTAL | Status: DC | PRN

## 2013-05-17 MED ORDER — ESTRADIOL 1 MG PO TABS
1.0000 mg | ORAL_TABLET | Freq: Every day | ORAL | Status: DC
  Administered 2013-05-18: 1 mg via ORAL
  Filled 2013-05-17: qty 1

## 2013-05-17 MED ORDER — VITAMIN D 50 MCG (2000 UT) PO CAPS: 2000.0000 [IU] | ORAL_CAPSULE | Freq: Every day | ORAL | Status: DC

## 2013-05-17 MED ORDER — PROMETHAZINE HCL 25 MG PO TABS: 25.0000 mg | ORAL_TABLET | Freq: Four times a day (QID) | ORAL | Status: DC | PRN

## 2013-05-17 MED ORDER — LIDOCAINE-EPINEPHRINE 1 %-1:100000 IJ SOLN
INTRAMUSCULAR | Status: DC | PRN
  Administered 2013-05-17: 20 mL

## 2013-05-17 MED ORDER — DEXAMETHASONE SODIUM PHOSPHATE 4 MG/ML IJ SOLN
INTRAMUSCULAR | Status: DC | PRN
  Administered 2013-05-17: 4 mg via INTRAVENOUS

## 2013-05-17 MED ORDER — HYDROCODONE-ACETAMINOPHEN 7.5-325 MG PO TABS: 1.0000 | ORAL_TABLET | Freq: Four times a day (QID) | ORAL | Status: DC | PRN

## 2013-05-17 MED ORDER — ONDANSETRON HCL 4 MG/2ML IJ SOLN: 4.0000 mg | Freq: Once | INTRAMUSCULAR | Status: DC | PRN

## 2013-05-17 MED ORDER — ASPIRIN EC 81 MG PO TBEC
81.0000 mg | DELAYED_RELEASE_TABLET | Freq: Every day | ORAL | Status: DC
Start: 2013-05-17 — End: 2013-05-18
  Administered 2013-05-17 – 2013-05-18 (×2): 81 mg via ORAL
  Filled 2013-05-17 (×2): qty 1

## 2013-05-17 MED ORDER — ONDANSETRON HCL 4 MG/2ML IJ SOLN
INTRAMUSCULAR | Status: DC | PRN
  Administered 2013-05-17: 4 mg via INTRAMUSCULAR

## 2013-05-17 MED ORDER — HYDROMORPHONE HCL PF 1 MG/ML IJ SOLN
0.2500 mg | INTRAMUSCULAR | Status: DC | PRN
  Administered 2013-05-17 (×2): 0.5 mg via INTRAVENOUS

## 2013-05-17 MED ORDER — VITAMIN D3 25 MCG (1000 UNIT) PO TABS
2000.0000 [IU] | ORAL_TABLET | Freq: Every day | ORAL | Status: DC
  Administered 2013-05-17 – 2013-05-18 (×2): 2000 [IU] via ORAL
  Filled 2013-05-17 (×2): qty 2

## 2013-05-17 MED ORDER — LIDOCAINE HCL (CARDIAC) 20 MG/ML IV SOLN
INTRAVENOUS | Status: DC | PRN
  Administered 2013-05-17: 100 mg via INTRATRACHEAL
  Administered 2013-05-17: 100 mg via INTRAVENOUS

## 2013-05-17 MED ORDER — LIDOCAINE-EPINEPHRINE 1 %-1:100000 IJ SOLN
INTRAMUSCULAR | Status: AC
  Filled 2013-05-17: qty 1

## 2013-05-17 SURGICAL SUPPLY — 48 items
ADH SKN CLS APL DERMABOND .7 (GAUZE/BANDAGES/DRESSINGS) ×1
APPLIER CLIP 9.375 SM OPEN (CLIP)
APR CLP SM 9.3 20 MLT OPN (CLIP)
ATTRACTOMAT 16X20 MAGNETIC DRP (DRAPES) IMPLANT
CANISTER SUCTION 2500CC (MISCELLANEOUS) ×2 IMPLANT
CLEANER TIP ELECTROSURG 2X2 (MISCELLANEOUS) ×2 IMPLANT
CLIP APPLIE 9.375 SM OPEN (CLIP) IMPLANT
CLOTH BEACON ORANGE TIMEOUT ST (SAFETY) ×1 IMPLANT
CONT SPEC 4OZ CLIKSEAL STRL BL (MISCELLANEOUS) ×2 IMPLANT
CORDS BIPOLAR (ELECTRODE) ×2 IMPLANT
COVER SURGICAL LIGHT HANDLE (MISCELLANEOUS) ×2 IMPLANT
DECANTER SPIKE VIAL GLASS SM (MISCELLANEOUS) ×2 IMPLANT
DERMABOND ADVANCED (GAUZE/BANDAGES/DRESSINGS) ×1
DERMABOND ADVANCED .7 DNX12 (GAUZE/BANDAGES/DRESSINGS) IMPLANT
DRAIN SNY 10 ROU (WOUND CARE) IMPLANT
ELECT COATED BLADE 2.86 ST (ELECTRODE) ×2 IMPLANT
ELECT REM PT RETURN 9FT ADLT (ELECTROSURGICAL) ×2
ELECTRODE REM PT RTRN 9FT ADLT (ELECTROSURGICAL) ×1 IMPLANT
EVACUATOR SILICONE 100CC (DRAIN) ×2 IMPLANT
GAUZE SPONGE 4X4 16PLY XRAY LF (GAUZE/BANDAGES/DRESSINGS) ×1 IMPLANT
GLOVE BIO SURGEON STRL SZ 6.5 (GLOVE) ×1 IMPLANT
GLOVE BIOGEL PI IND STRL 7.5 (GLOVE) IMPLANT
GLOVE BIOGEL PI IND STRL 8 (GLOVE) IMPLANT
GLOVE BIOGEL PI INDICATOR 7.5 (GLOVE) ×1
GLOVE BIOGEL PI INDICATOR 8 (GLOVE) ×1
GLOVE ECLIPSE 7.5 STRL STRAW (GLOVE) ×2 IMPLANT
GLOVE SURG SS PI 7.5 STRL IVOR (GLOVE) ×1 IMPLANT
GOWN PREVENTION PLUS LG XLONG (DISPOSABLE) ×4 IMPLANT
KIT BASIN OR (CUSTOM PROCEDURE TRAY) ×2 IMPLANT
KIT ROOM TURNOVER OR (KITS) ×2 IMPLANT
NEEDLE 27GAX1X1/2 (NEEDLE) ×2 IMPLANT
NS IRRIG 1000ML POUR BTL (IV SOLUTION) ×2 IMPLANT
PAD ARMBOARD 7.5X6 YLW CONV (MISCELLANEOUS) ×4 IMPLANT
PENCIL FOOT CONTROL (ELECTRODE) ×2 IMPLANT
SPECIMEN JAR MEDIUM (MISCELLANEOUS) IMPLANT
SPONGE INTESTINAL PEANUT (DISPOSABLE) IMPLANT
STAPLER VISISTAT 35W (STAPLE) ×2 IMPLANT
SUT CHROMIC 3 0 SH 27 (SUTURE) IMPLANT
SUT CHROMIC 4 0 PS 2 18 (SUTURE) ×4 IMPLANT
SUT ETHILON 3 0 PS 1 (SUTURE) ×2 IMPLANT
SUT ETHILON 5 0 P 3 18 (SUTURE)
SUT NYLON ETHILON 5-0 P-3 1X18 (SUTURE) IMPLANT
SUT SILK 3 0 REEL (SUTURE) IMPLANT
SUT SILK 4 0 REEL (SUTURE) ×2 IMPLANT
TOWEL OR 17X24 6PK STRL BLUE (TOWEL DISPOSABLE) ×2 IMPLANT
TOWEL OR 17X26 10 PK STRL BLUE (TOWEL DISPOSABLE) ×2 IMPLANT
TRAY ENT MC OR (CUSTOM PROCEDURE TRAY) ×2 IMPLANT
WATER STERILE IRR 1000ML POUR (IV SOLUTION) IMPLANT

## 2013-05-17 NOTE — Preoperative (Signed)
Beta Blockers   Reason not to administer Beta Blockers:Not Applicable 

## 2013-05-17 NOTE — Anesthesia Postprocedure Evaluation (Signed)
  Anesthesia Post-op Note  Patient: Ashley Lamb  Procedure(s) Performed: Procedure(s): RIGHT THYROIDECTOMY (Right)  Patient Location: PACU  Anesthesia Type:General  Level of Consciousness: awake, alert , sedated and patient cooperative  Airway and Oxygen Therapy: Patient Spontanous Breathing  Post-op Pain: mild  Post-op Assessment: Post-op Vital signs reviewed, Patient's Cardiovascular Status Stable, Respiratory Function Stable, Patent Airway, No signs of Nausea or vomiting and Pain level controlled  Post-op Vital Signs: stable  Complications: No apparent anesthesia complications

## 2013-05-17 NOTE — Interval H&P Note (Signed)
History and Physical Interval Note:  05/17/2013 7:16 AM  Ashley Lamb  has presented today for surgery, with the diagnosis of right thyroid mass  The various methods of treatment have been discussed with the patient and family. After consideration of risks, benefits and other options for treatment, the patient has consented to  Procedure(s): RIGHT THYROIDECTOMY/POSSIBLE TOTAL THYROIDECTOMY (Right) as a surgical intervention .  The patient's history has been reviewed, patient examined, no change in status, stable for surgery.  I have reviewed the patient's chart and labs.  Questions were answered to the patient's satisfaction.     Hamna Asa

## 2013-05-17 NOTE — Transfer of Care (Signed)
Immediate Anesthesia Transfer of Care Note  Patient: Ashley Lamb  Procedure(s) Performed: Procedure(s): RIGHT THYROIDECTOMY (Right)  Patient Location: PACU  Anesthesia Type:General  Level of Consciousness: awake, alert , oriented and patient cooperative  Airway & Oxygen Therapy: Patient Spontanous Breathing and Patient connected to nasal cannula oxygen  Post-op Assessment: Report given to PACU RN and Post -op Vital signs reviewed and stable  Post vital signs: Reviewed  Complications: No apparent anesthesia complications

## 2013-05-17 NOTE — Progress Notes (Signed)
   ENT Progress Note: s/p Procedure(s): RIGHT THYROIDECTOMY   Subjective: Pt stable, mild ST  Objective: Vital signs in last 24 hours: Temp:  [97 F (36.1 C)-98.8 F (37.1 C)] 97.9 F (36.6 C) (10/17 1348) Pulse Rate:  [66-75] 70 (10/17 1348) Resp:  [8-20] 16 (10/17 1348) BP: (128-166)/(65-85) 128/65 mmHg (10/17 1348) SpO2:  [93 %-100 %] 98 % (10/17 1348) Weight:  [99 kg (218 lb 4.1 oz)] 99 kg (218 lb 4.1 oz) (10/17 1105) Weight change:  Last BM Date: 05/17/13  Intake/Output from previous day:   Intake/Output this shift: Total I/O In: 1363.8 [P.O.:240; I.V.:1123.8] Out: 217 [Urine:200; Drains:17]  Labs:  Recent Labs  05/17/13 0634  WBC 6.8  HGB 12.5  HCT 36.2  PLT 184    Recent Labs  05/17/13 0634  NA 142  K 3.8  CL 109  CO2 24  GLUCOSE 98  BUN 19  CALCIUM 9.1    Studies/Results: Dg Chest 2 View  05/17/2013   CLINICAL DATA:  Preoperative evaluation  EXAM: CHEST  2 VIEW  COMPARISON:  Prior radiograph from 01/28/2011  FINDINGS: Cardiac and mediastinal silhouettes are stable in size and contour, and remain within normal limits. Mild tortuosity of the intrathoracic aorta is noted.  The lungs are well inflated. No airspace consolidation, pleural effusion, or pulmonary edema. There is no pneumothorax.  Osseous structures are within normal limits. Multilevel degenerative spurring is noted within the mid thoracic spine.  IMPRESSION: No acute cardiopulmonary abnormality.   Electronically Signed   By: Rise Mu M.D.   On: 05/17/2013 07:01     PHYSICAL EXAM: Inc intact, no erythema or swelling Airway stable   Assessment/Plan: Pt doing well postop Plan O/N Obs. Monitor JP output     Khoi Hamberger 05/17/2013, 5:33 PM

## 2013-05-17 NOTE — Anesthesia Procedure Notes (Signed)
Procedure Name: Intubation Date/Time: 05/17/2013 7:32 AM Performed by: Lovie Chol Pre-anesthesia Checklist: Patient identified, Suction available, Emergency Drugs available, Patient being monitored and Timeout performed Patient Re-evaluated:Patient Re-evaluated prior to inductionOxygen Delivery Method: Circle system utilized Preoxygenation: Pre-oxygenation with 100% oxygen Intubation Type: IV induction Ventilation: Mask ventilation without difficulty Laryngoscope Size: Miller and 2 Grade View: Grade I Tube type: Oral Tube size: 7.5 mm Number of attempts: 1 Airway Equipment and Method: Stylet Placement Confirmation: ETT inserted through vocal cords under direct vision,  positive ETCO2,  CO2 detector and breath sounds checked- equal and bilateral Secured at: 21 cm Tube secured with: Tape Dental Injury: Teeth and Oropharynx as per pre-operative assessment

## 2013-05-17 NOTE — Op Note (Addendum)
OPERATIVE REPORT  DATE OF SURGERY: 05/17/2013  PATIENT:  Ashley Lamb,  72 y.o. female  PRE-OPERATIVE DIAGNOSIS:  right thyroid mass  POST-OPERATIVE DIAGNOSIS:  right thyroid mass  PROCEDURE:  Procedure(s): RIGHT THYROIDECTOMY  SURGEON:  Susy Frizzle, MD  ASSISTANTS: Aquilla Hacker, PA  ANESTHESIA:   General   EBL:  10 ml  DRAINS: 10 French round J-P  LOCAL MEDICATIONS USED:  None  SPECIMEN:  Right thyroid lobe, frozen section, follicular neoplasm  COUNTS:  Correct  PROCEDURE DETAILS: The patient was taken to the operating room and placed on the operating table in the supine position. A shoulder roll was placed beneath the shoulder blades and the neck was extended. The neck was prepped and draped in a standard fashion. A low collar transverse incision was outlined marking pen and was incised with electrocautery. Dissection was continued down through the platysma layer. Subplatysmal flaps were elevated superiorly to the thyroid cartilage and inferiorly to the clavicle. Self-retaining thyroid retractor was used throughout the case.  The midline fascia was divided. The strap muscles were reflected laterally off of the right lobe of the thyroid. The right lobe was enlarged but soft. It was reflected medially and inferiorly while the superior pole was dissected. The superior pole vasculature were separately identified, ligated between clamps and divided. 4-0 silk ties were used throughout the case. A superior parathyroid was identified and preserved with its blood supply. The dissection was accomplished right on the capsule of the gland. As the gland was brought forward and inferiorly the recurrent nerve was identified and preserved. Careful dissection down along the middle and inferior portion of the gland reveals an inferior parathyroid which was also preserved with its blood supply. The ligament of Allyson Sabal was divided as the gland was brought forward off the trachea. The isthmus was  very small and was divided using cautery. The right lobe was sent for frozen section analysis. Hemostasis was completed of the surgical bed. It was irrigated with saline. Closure was accomplished using 4-0 chromic on the midline fascia, platysma layer, and subcuticular running closure. Dermabond was used on the skin. The drain was exited through a separate stab incision and secured in place with nylon suture. The patient was then awakened, extubated and transferred to recovery in stable condition. No masses were palpable on the left.   PATIENT DISPOSITION:  To PACU, stable

## 2013-05-17 NOTE — Anesthesia Preprocedure Evaluation (Addendum)
Anesthesia Evaluation  Patient identified by MRN, date of birth, ID band Patient awake    Reviewed: Allergy & Precautions, H&P , NPO status , Patient's Chart, lab work & pertinent test results, reviewed documented beta blocker date and time   History of Anesthesia Complications Negative for: history of anesthetic complications  Airway Mallampati: II TM Distance: >3 FB Neck ROM: Full    Dental  (+) Edentulous Upper, Edentulous Lower and Dental Advisory Given   Pulmonary pneumonia -,  breath sounds clear to auscultation        Cardiovascular hypertension, Pt. on medications and Pt. on home beta blockers Rhythm:Regular Rate:Normal     Neuro/Psych negative neurological ROS  negative psych ROS   GI/Hepatic negative GI ROS, Neg liver ROS,   Endo/Other  negative endocrine ROS  Renal/GU Renal disease     Musculoskeletal   Abdominal   Peds  Hematology negative hematology ROS (+)   Anesthesia Other Findings   Reproductive/Obstetrics negative OB ROS                          Anesthesia Physical Anesthesia Plan  ASA: II  Anesthesia Plan: General   Post-op Pain Management:    Induction: Intravenous  Airway Management Planned: Oral ETT  Additional Equipment:   Intra-op Plan:   Post-operative Plan: Extubation in OR  Informed Consent: I have reviewed the patients History and Physical, chart, labs and discussed the procedure including the risks, benefits and alternatives for the proposed anesthesia with the patient or authorized representative who has indicated his/her understanding and acceptance.     Plan Discussed with: CRNA, Anesthesiologist and Surgeon  Anesthesia Plan Comments:         Anesthesia Quick Evaluation

## 2013-05-18 MED ORDER — HYDROCHLOROTHIAZIDE 12.5 MG PO CAPS
12.5000 mg | ORAL_CAPSULE | Freq: Every day | ORAL | Status: DC
  Administered 2013-05-18: 12.5 mg via ORAL

## 2013-05-18 NOTE — Progress Notes (Signed)
   ENT Progress Note: POD #1 s/p Procedure(s): RIGHT THYROIDECTOMY   Subjective: Tol po, min ST  Objective: Vital signs in last 24 hours: Temp:  [97.9 F (36.6 C)-98.8 F (37.1 C)] 98.4 F (36.9 C) (10/18 0540) Pulse Rate:  [66-75] 71 (10/18 0540) Resp:  [8-18] 18 (10/18 0540) BP: (118-161)/(58-87) 152/87 mmHg (10/18 0540) SpO2:  [93 %-98 %] 97 % (10/18 0540) Weight:  [99 kg (218 lb 4.1 oz)] 99 kg (218 lb 4.1 oz) (10/17 1105) Weight change: 4.652 kg (10 lb 4.1 oz) Last BM Date: 05/17/13  Intake/Output from previous day: 10/17 0701 - 10/18 0700 In: 1363.8 [P.O.:240; I.V.:1123.8] Out: 557 [Urine:500; Drains:57] Intake/Output this shift:    Labs:  Recent Labs  05/17/13 0634  WBC 6.8  HGB 12.5  HCT 36.2  PLT 184    Recent Labs  05/17/13 0634  NA 142  K 3.8  CL 109  CO2 24  GLUCOSE 98  BUN 19  CALCIUM 9.1    Studies/Results: Dg Chest 2 View  05/17/2013   CLINICAL DATA:  Preoperative evaluation  EXAM: CHEST  2 VIEW  COMPARISON:  Prior radiograph from 01/28/2011  FINDINGS: Cardiac and mediastinal silhouettes are stable in size and contour, and remain within normal limits. Mild tortuosity of the intrathoracic aorta is noted.  The lungs are well inflated. No airspace consolidation, pleural effusion, or pulmonary edema. There is no pneumothorax.  Osseous structures are within normal limits. Multilevel degenerative spurring is noted within the mid thoracic spine.  IMPRESSION: No acute cardiopulmonary abnormality.   Electronically Signed   By: Rise Mu M.D.   On: 05/17/2013 07:01     PHYSICAL EXAM: Inc intact, no swelling JP outpt decreased - removed voice and airway stable   Assessment/Plan: Stable postop D/C to home    Erinn Huskins 05/18/2013, 8:39 AM

## 2013-05-18 NOTE — Progress Notes (Signed)
Discharge Note. Pt given Rx's, discharge instructions, and care instructions. Pt receptive. Pt reported she didn't have any questions and was able to verbalize everything that was taught. Pt ready for discharge when family arrives to take her home.

## 2013-05-18 NOTE — Discharge Summary (Signed)
Physician Discharge Summary  Patient ID: Ashley Lamb MRN: [id number] DOB/AGE: [birth year] 72 y.o.  Admit date: 05/17/2013 Discharge date: 05/18/2013  Admission Diagnoses:  Active Problems:   * No active hospital problems. *   Discharge Diagnoses:  Same  Surgeries: Procedure(s): RIGHT THYROIDECTOMY on 05/17/2013   Consultants: None  Discharged Condition: Improved  Hospital Course: Ashley Lamb is an 72 y.o. female who was admitted 05/17/2013 with a diagnosis of thyroid mass and went to the operating room on 05/17/2013 and underwent the above named procedures.   Stable postop, drain removed POD #1, d/c'ed to home.  Recent vital signs:  Filed Vitals:   05/18/13 0540  BP: 152/87  Pulse: 71  Temp: 98.4 F (36.9 C)  Resp: 18    Recent laboratory studies:  Results for orders placed during the hospital encounter of 05/17/13  BASIC METABOLIC PANEL      Result Value Range   Sodium 142  135 - 145 mEq/L   Potassium 3.8  3.5 - 5.1 mEq/L   Chloride 109  96 - 112 mEq/L   CO2 24  19 - 32 mEq/L   Glucose, Bld 98  70 - 99 mg/dL   BUN 19  6 - 23 mg/dL   Creatinine, Ser 6.21  0.50 - 1.10 mg/dL   Calcium 9.1  8.4 - 30.8 mg/dL   GFR calc non Af Amer 83 (*) >90 mL/min   GFR calc Af Amer >90  >90 mL/min  CBC      Result Value Range   WBC 6.8  4.0 - 10.5 K/uL   RBC 4.12  3.87 - 5.11 MIL/uL   Hemoglobin 12.5  12.0 - 15.0 g/dL   HCT 65.7  84.6 - 96.2 %   MCV 87.9  78.0 - 100.0 fL   MCH 30.3  26.0 - 34.0 pg   MCHC 34.5  30.0 - 36.0 g/dL   RDW 95.2  84.1 - 32.4 %   Platelets 184  150 - 400 K/uL    Discharge Medications:     Medication List         amLODipine 5 MG tablet  Commonly known as:  NORVASC  Take 5 mg by mouth daily.     aspirin EC 81 MG tablet  Take 81 mg by mouth daily.     estradiol 1 MG tablet  Commonly known as:  ESTRACE  Take 1 mg by mouth daily.     hydrochlorothiazide 25 MG tablet  Commonly known as:  HYDRODIURIL  Take 12.5 mg by mouth daily.      HYDROcodone-acetaminophen 7.5-325 MG per tablet  Commonly known as:  NORCO  Take 1 tablet by mouth every 6 (six) hours as needed for pain.     ibuprofen 200 MG tablet  Commonly known as:  ADVIL,MOTRIN  Take 200-400 mg by mouth 2 (two) times daily as needed for pain.     metoprolol 50 MG tablet  Commonly known as:  LOPRESSOR  Take 50 mg by mouth 2 (two) times daily.     potassium chloride 10 MEQ tablet  Commonly known as:  K-DUR,KLOR-CON  Take 10 mEq by mouth 2 (two) times daily.     promethazine 25 MG suppository  Commonly known as:  PHENERGAN  Place 1 suppository (25 mg total) rectally every 6 (six) hours as needed for nausea.     Vitamin D 2000 UNITS Caps  Take 2,000 Units by mouth daily.        Diagnostic Studies:  Dg Chest 2 View  05/17/2013   CLINICAL DATA:  Preoperative evaluation  EXAM: CHEST  2 VIEW  COMPARISON:  Prior radiograph from 01/28/2011  FINDINGS: Cardiac and mediastinal silhouettes are stable in size and contour, and remain within normal limits. Mild tortuosity of the intrathoracic aorta is noted.  The lungs are well inflated. No airspace consolidation, pleural effusion, or pulmonary edema. There is no pneumothorax.  Osseous structures are within normal limits. Multilevel degenerative spurring is noted within the mid thoracic spine.  IMPRESSION: No acute cardiopulmonary abnormality.   Electronically Signed   By: Rise Mu M.D.   On: 05/17/2013 07:01    Disposition: 01-Home or Self Care      Discharge Orders   Future Orders Complete By Expires   Diet - low sodium heart healthy  As directed    Discharge instructions  As directed    Comments:     1. Limited activity 2. Liquid and soft diet, advance as tolerated 3. May bathe and shower day after surgery 4. Wound care - Gentle cleaning with soap and water 5. DO NOT APPLY ANY OINTMENT 6. Elevate Head of Bed   Increase activity slowly  As directed       Follow-up Information   Follow up with  Serena Colonel, MD. Schedule an appointment as soon as possible for a visit in 1 week.   Specialty:  Otolaryngology   Contact information:   [address] [place] [address] [phone]        Signed: Osborn Coho 05/18/2013, 8:45 AM

## 2013-05-20 ENCOUNTER — Other Ambulatory Visit (HOSPITAL_COMMUNITY): Payer: Medicare Other

## 2013-05-20 ENCOUNTER — Encounter (HOSPITAL_COMMUNITY): Payer: Self-pay | Admitting: Otolaryngology

## 2013-05-27 DIAGNOSIS — Z23 Encounter for immunization: Secondary | ICD-10-CM | POA: Diagnosis not present

## 2013-05-29 DIAGNOSIS — M159 Polyosteoarthritis, unspecified: Secondary | ICD-10-CM | POA: Diagnosis not present

## 2013-05-29 DIAGNOSIS — N951 Menopausal and female climacteric states: Secondary | ICD-10-CM | POA: Diagnosis not present

## 2013-05-29 DIAGNOSIS — I10 Essential (primary) hypertension: Secondary | ICD-10-CM | POA: Diagnosis not present

## 2013-05-29 DIAGNOSIS — E559 Vitamin D deficiency, unspecified: Secondary | ICD-10-CM | POA: Diagnosis not present

## 2013-11-15 DIAGNOSIS — M25559 Pain in unspecified hip: Secondary | ICD-10-CM | POA: Diagnosis not present

## 2013-11-28 DIAGNOSIS — E7800 Pure hypercholesterolemia, unspecified: Secondary | ICD-10-CM | POA: Diagnosis not present

## 2013-11-28 DIAGNOSIS — I10 Essential (primary) hypertension: Secondary | ICD-10-CM | POA: Diagnosis not present

## 2013-11-28 DIAGNOSIS — Z23 Encounter for immunization: Secondary | ICD-10-CM | POA: Diagnosis not present

## 2013-11-28 DIAGNOSIS — N951 Menopausal and female climacteric states: Secondary | ICD-10-CM | POA: Diagnosis not present

## 2013-11-28 DIAGNOSIS — E559 Vitamin D deficiency, unspecified: Secondary | ICD-10-CM | POA: Diagnosis not present

## 2013-11-28 DIAGNOSIS — M159 Polyosteoarthritis, unspecified: Secondary | ICD-10-CM | POA: Diagnosis not present

## 2013-12-12 ENCOUNTER — Other Ambulatory Visit: Payer: Self-pay

## 2013-12-12 DIAGNOSIS — M25559 Pain in unspecified hip: Secondary | ICD-10-CM | POA: Diagnosis not present

## 2013-12-12 DIAGNOSIS — Z1231 Encounter for screening mammogram for malignant neoplasm of breast: Secondary | ICD-10-CM

## 2013-12-18 DIAGNOSIS — M169 Osteoarthritis of hip, unspecified: Secondary | ICD-10-CM | POA: Diagnosis not present

## 2013-12-18 DIAGNOSIS — M1610 Unilateral primary osteoarthritis, unspecified hip: Secondary | ICD-10-CM | POA: Diagnosis not present

## 2013-12-26 ENCOUNTER — Other Ambulatory Visit: Payer: Self-pay | Admitting: Orthopaedic Surgery

## 2013-12-27 DIAGNOSIS — M25559 Pain in unspecified hip: Secondary | ICD-10-CM | POA: Diagnosis not present

## 2014-01-14 ENCOUNTER — Encounter (HOSPITAL_COMMUNITY): Payer: Self-pay | Admitting: Pharmacy Technician

## 2014-01-14 NOTE — Pre-Procedure Instructions (Signed)
PARISH AUGUSTINE  01/14/2014   Your procedure is scheduled on:  Tuesday, June 30th  Report to [facility] Admitting at 0530 AM.  Call this number if you have problems the morning of surgery: [phone]   Remember:   Do not eat food or drink liquids after midnight.   Take these medicines the morning of surgery with A SIP OF WATER: lopressor, norvasc, hydrocodone if needed  Stop aspirin, vitamins, advil on 6/23   Do not wear jewelry, make-up or nail polish.  Do not wear lotions, powders, or perfumes. You may wear deodorant.  Do not shave 48 hours prior to surgery. Men may shave face and neck.  Do not bring valuables to the hospital.  [facility] is not responsible  for any belongings or valuables.               Contacts, dentures or bridgework may not be worn into surgery.  Leave suitcase in the car. After surgery it may be brought to your room.  For patients admitted to the hospital, discharge time is determined by your treatment team.               Patients discharged the day of surgery will not be allowed to drive home.  Please read over the following fact sheets that you were given: Pain Booklet, Coughing and Deep Breathing, Blood Transfusion Information, MRSA Information and Surgical Site Infection Prevention [facility] - Preparing for Surgery  Before surgery, you can play an important role.  Because skin is not sterile, your skin needs to be as free of germs as possible.  You can reduce the number of germs on you skin by washing with CHG (chlorahexidine gluconate) soap before surgery.  CHG is an antiseptic cleaner which kills germs and bonds with the skin to continue killing germs even after washing.  Please DO NOT use if you have an allergy to CHG or antibacterial soaps.  If your skin becomes reddened/irritated stop using the CHG and inform your nurse when you arrive at Short Stay.  Do not shave (including legs and underarms) for at least 48 hours prior to the first CHG  shower.  You may shave your face.  Please follow these instructions carefully:   1.  Shower with CHG Soap the night before surgery and the morning of Surgery.  2.  If you choose to wash your hair, wash your hair first as usual with your normal shampoo.  3.  After you shampoo, rinse your hair and body thoroughly to remove the shampoo.  4.  Use CHG as you would any other liquid soap.  You can apply CHG directly to the skin and wash gently with scrungie or a clean washcloth.  5.  Apply the CHG Soap to your body ONLY FROM THE NECK DOWN.  Do not use on open wounds or open sores.  Avoid contact with your eyes, ears, mouth and genitals (private parts).  Wash genitals (private parts) with your normal soap.  6.  Wash thoroughly, paying special attention to the area where your surgery will be performed.  7.  Thoroughly rinse your body with warm water from the neck down.  8.  DO NOT shower/wash with your normal soap after using and rinsing off the CHG Soap.  9.  Pat yourself dry with a clean towel.            10.  Wear clean pajamas.  11.  Place clean sheets on your bed the night of your first shower and do not sleep with pets.  Day of Surgery  Do not apply any lotions/deoderants the morning of surgery.  Please wear clean clothes to the hospital/surgery center.

## 2014-01-15 ENCOUNTER — Encounter (HOSPITAL_COMMUNITY): Payer: Self-pay

## 2014-01-15 ENCOUNTER — Encounter (HOSPITAL_COMMUNITY)
Admission: RE | Admit: 2014-01-15 | Discharge: 2014-01-15 | Disposition: A | Discharge status: Home / Self Care | Payer: Medicare Other | Source: Ambulatory Visit | Attending: Orthopaedic Surgery | Admitting: Orthopaedic Surgery

## 2014-01-15 DIAGNOSIS — Z01812 Encounter for preprocedural laboratory examination: Secondary | ICD-10-CM | POA: Diagnosis not present

## 2014-01-15 LAB — CBC WITH DIFFERENTIAL/PLATELET
BASOS ABS: 0.1 10*3/uL (ref 0.0–0.1)
BASOS PCT: 1 % (ref 0–1)
EOS ABS: 0.3 10*3/uL (ref 0.0–0.7)
EOS PCT: 3 % (ref 0–5)
HEMATOCRIT: 39.1 % (ref 36.0–46.0)
Hemoglobin: 13 g/dL (ref 12.0–15.0)
Lymphocytes Relative: 61 % — ABNORMAL HIGH (ref 12–46)
Lymphs Abs: 5.5 10*3/uL — ABNORMAL HIGH (ref 0.7–4.0)
MCH: 31.5 pg (ref 26.0–34.0)
MCHC: 33.2 g/dL (ref 30.0–36.0)
MCV: 94.7 fL (ref 78.0–100.0)
MONO ABS: 0.5 10*3/uL (ref 0.1–1.0)
Monocytes Relative: 6 % (ref 3–12)
Neutro Abs: 2.6 10*3/uL (ref 1.7–7.7)
Neutrophils Relative %: 29 % — ABNORMAL LOW (ref 43–77)
PLATELETS: 220 10*3/uL (ref 150–400)
RBC: 4.13 MIL/uL (ref 3.87–5.11)
RDW: 14.1 % (ref 11.5–15.5)
WBC: 9.1 10*3/uL (ref 4.0–10.5)

## 2014-01-15 LAB — BASIC METABOLIC PANEL
BUN: 17 mg/dL (ref 6–23)
CHLORIDE: 105 meq/L (ref 96–112)
CO2: 22 meq/L (ref 19–32)
Calcium: 9.4 mg/dL (ref 8.4–10.5)
Creatinine, Ser: 0.97 mg/dL (ref 0.50–1.10)
GFR calc Af Amer: 67 mL/min — ABNORMAL LOW (ref >90)
GFR calc non Af Amer: 58 mL/min — ABNORMAL LOW (ref >90)
Glucose, Bld: 102 mg/dL — ABNORMAL HIGH (ref 70–99)
Potassium: 4.3 mEq/L (ref 3.7–5.3)
Sodium: 142 mEq/L (ref 137–147)

## 2014-01-15 LAB — URINALYSIS, ROUTINE W REFLEX MICROSCOPIC
BILIRUBIN URINE: NEGATIVE
GLUCOSE, UA: NEGATIVE mg/dL
HGB URINE DIPSTICK: NEGATIVE
Ketones, ur: 15 mg/dL — AB
Leukocytes, UA: NEGATIVE
Nitrite: NEGATIVE
PH: 5.5 (ref 5.0–8.0)
Protein, ur: NEGATIVE mg/dL
Urobilinogen, UA: 0.2 mg/dL (ref 0.0–1.0)

## 2014-01-15 LAB — TYPE AND SCREEN
ABO/RH(D): A POS
Antibody Screen: NEGATIVE

## 2014-01-15 LAB — PROTIME-INR
INR: 0.97 (ref 0.00–1.49)
PROTHROMBIN TIME: 12.7 s (ref 11.6–15.2)

## 2014-01-15 LAB — APTT: APTT: 29 s (ref 24–37)

## 2014-01-15 LAB — SURGICAL PCR SCREEN
MRSA, PCR: NEGATIVE
STAPHYLOCOCCUS AUREUS: POSITIVE — AB

## 2014-01-27 MED ORDER — CEFAZOLIN SODIUM-DEXTROSE 2-3 GM-% IV SOLR
2.0000 g | INTRAVENOUS | Status: AC
  Administered 2014-01-28: 2 g via INTRAVENOUS

## 2014-01-27 MED ORDER — LACTATED RINGERS IV SOLN: INTRAVENOUS | Status: DC

## 2014-01-27 NOTE — H&P (Signed)
TOTAL HIP ADMISSION H&P  Patient is admitted for left total hip arthroplasty.  Subjective:  Chief Complaint: left hip pain  HPI: Ashley Lamb, 73 y.o. female, has a history of pain and functional disability in the left hip(s) due to arthritis and patient has failed non-surgical conservative treatments for greater than 12 weeks to include NSAID's and/or analgesics, corticosteriod injections, supervised PT with diminished ADL's post treatment, use of assistive devices and activity modification.  Onset of symptoms was gradual starting 5 years ago with gradually worsening course since that time.The patient noted no past surgery on the left hip(s).  Patient currently rates pain in the left hip at 9 out of 10 with activity. Patient has night pain, worsening of pain with activity and weight bearing, trendelenberg gait, pain that interfers with activities of daily living and pain with passive range of motion. Patient has evidence of subchondral sclerosis, joint subluxation and joint space narrowing by imaging studies. This condition presents safety issues increasing the risk of falls. This patient has had none.  There is no current active infection.  There are no active problems to display for this patient.  Past Medical History  Diagnosis Date  . Hypertension   . Kidney stones     Hx: of  . Arthritis   . Pneumonia     remote history    Past Surgical History  Procedure Laterality Date  . Abdominal hysterectomy    . Back surgery    . Cholecystectomy    . Colonoscopy      Hx: of  . Tonsillectomy    . Thyroidectomy Right 05/17/2013    Procedure: RIGHT THYROIDECTOMY;  Surgeon: Izora Gala, MD;  Location: [place];  Service: ENT;  Laterality: Right;    No prescriptions prior to admission   No Known Allergies  History  Substance Use Topics  . Smoking status: Former Smoker -- 0.50 packs/day for 10 years    Types: Cigarettes  . Smokeless tobacco: Never Used     Comment: "quit many years ago  can't remember when"  . Alcohol Use: No    Family History  Problem Relation Age of Onset  . Hypertension Other      Review of Systems  Constitutional: Negative.   HENT: Negative.   Eyes: Negative.   Respiratory: Negative.   Cardiovascular: Negative.   Gastrointestinal: Negative.   Genitourinary: Negative.   Musculoskeletal: Positive for joint pain.  Skin: Negative.   Neurological: Negative.   Endo/Heme/Allergies: Negative.   Psychiatric/Behavioral: Negative.     Objective:  Physical Exam  Constitutional: She appears well-developed.  HENT:  Head: Normocephalic.  Eyes: Pupils are equal, round, and reactive to light.  Neck: Normal range of motion.  Cardiovascular: Normal rate.   Respiratory: Effort normal.  Musculoskeletal:  Left hip exam: Almost no rotation severe pain with any attempts of rotation.  Hip flexion is 90 10 hip flexion contracture.  Neurological: She is alert.  Skin: Skin is warm.  Psychiatric: She has a normal mood and affect.    Vital signs in last 24 hours:    Labs:   Estimated body mass index is 31.53 kg/(m^2) as calculated from the following:   Height as of 05/16/13: 5' 9.75" (1.772 m).   Weight as of 05/17/13: 99 kg (218 lb 4.1 oz).   Imaging Review Plain radiographs demonstrate severe degenerative joint disease of the left hip(s). The bone quality appears to be good for age and reported activity level.  Assessment/Plan:  End stage arthritis,  left hip(s)  The patient history, physical examination, clinical judgement of the provider and imaging studies are consistent with end stage degenerative joint disease of the left hip(s) and total hip arthroplasty is deemed medically necessary. The treatment options including medical management, injection therapy, arthroscopy and arthroplasty were discussed at length. The risks and benefits of total hip arthroplasty were presented and reviewed. The risks due to aseptic loosening, infection, stiffness,  dislocation/subluxation,  thromboembolic complications and other imponderables were discussed.  The patient acknowledged the explanation, agreed to proceed with the plan and consent was signed. Patient is being admitted for inpatient treatment for surgery, pain control, PT, OT, prophylactic antibiotics, VTE prophylaxis, progressive ambulation and ADL's and discharge planning.The patient is planning to be discharged home with home health services

## 2014-01-28 ENCOUNTER — Inpatient Hospital Stay (HOSPITAL_COMMUNITY): Payer: Medicare Other

## 2014-01-28 ENCOUNTER — Encounter (HOSPITAL_COMMUNITY): Payer: Medicare Other | Admitting: Anesthesiology

## 2014-01-28 ENCOUNTER — Inpatient Hospital Stay (HOSPITAL_COMMUNITY): Payer: Medicare Other | Admitting: Anesthesiology

## 2014-01-28 ENCOUNTER — Encounter (HOSPITAL_COMMUNITY)
Admission: RE | Disposition: A | Discharge status: Home Health | Payer: Self-pay | Source: Ambulatory Visit | Attending: Orthopaedic Surgery

## 2014-01-28 ENCOUNTER — Encounter (HOSPITAL_COMMUNITY): Payer: Self-pay | Admitting: *Deleted

## 2014-01-28 ENCOUNTER — Inpatient Hospital Stay (HOSPITAL_COMMUNITY)
Admission: RE | Admit: 2014-01-28 | Discharge: 2014-01-30 | DRG: 470 | Disposition: A | Discharge status: Home Health | Payer: Medicare Other | Source: Ambulatory Visit | Attending: Orthopaedic Surgery | Admitting: Orthopaedic Surgery

## 2014-01-28 DIAGNOSIS — Z87891 Personal history of nicotine dependence: Secondary | ICD-10-CM

## 2014-01-28 DIAGNOSIS — Z96649 Presence of unspecified artificial hip joint: Secondary | ICD-10-CM | POA: Diagnosis not present

## 2014-01-28 DIAGNOSIS — M25559 Pain in unspecified hip: Secondary | ICD-10-CM | POA: Diagnosis not present

## 2014-01-28 DIAGNOSIS — Z471 Aftercare following joint replacement surgery: Secondary | ICD-10-CM | POA: Diagnosis not present

## 2014-01-28 DIAGNOSIS — M1612 Unilateral primary osteoarthritis, left hip: Secondary | ICD-10-CM

## 2014-01-28 DIAGNOSIS — I10 Essential (primary) hypertension: Secondary | ICD-10-CM | POA: Diagnosis present

## 2014-01-28 DIAGNOSIS — M169 Osteoarthritis of hip, unspecified: Secondary | ICD-10-CM | POA: Diagnosis not present

## 2014-01-28 DIAGNOSIS — M1610 Unilateral primary osteoarthritis, unspecified hip: Principal | ICD-10-CM | POA: Diagnosis present

## 2014-01-28 HISTORY — PX: TOTAL HIP ARTHROPLASTY: SHX124

## 2014-01-28 SURGERY — ARTHROPLASTY, HIP, TOTAL, ANTERIOR APPROACH
Anesthesia: General | Site: Hip | Laterality: Left

## 2014-01-28 MED ORDER — METOCLOPRAMIDE HCL 10 MG PO TABS: 5.0000 mg | ORAL_TABLET | Freq: Three times a day (TID) | ORAL | Status: DC | PRN

## 2014-01-28 MED ORDER — ROCURONIUM BROMIDE 100 MG/10ML IV SOLN
INTRAVENOUS | Status: DC | PRN
  Administered 2014-01-28: 50 mg via INTRAVENOUS
  Administered 2014-01-28: 10 mg via INTRAVENOUS

## 2014-01-28 MED ORDER — PHENOL 1.4 % MT LIQD: 1.0000 | OROMUCOSAL | Status: DC | PRN

## 2014-01-28 MED ORDER — METOCLOPRAMIDE HCL 5 MG/ML IJ SOLN: 5.0000 mg | Freq: Three times a day (TID) | INTRAMUSCULAR | Status: DC | PRN

## 2014-01-28 MED ORDER — FERROUS SULFATE 325 (65 FE) MG PO TABS
325.0000 mg | ORAL_TABLET | Freq: Two times a day (BID) | ORAL | Status: DC
  Administered 2014-01-28 – 2014-01-30 (×4): 325 mg via ORAL
  Filled 2014-01-28 (×6): qty 1

## 2014-01-28 MED ORDER — METOCLOPRAMIDE HCL 5 MG/ML IJ SOLN: 10.0000 mg | Freq: Once | INTRAMUSCULAR | Status: DC | PRN

## 2014-01-28 MED ORDER — PHENYLEPHRINE HCL 10 MG/ML IJ SOLN
10.0000 mg | INTRAVENOUS | Status: DC | PRN
  Administered 2014-01-28: 10 ug/min via INTRAVENOUS

## 2014-01-28 MED ORDER — AMLODIPINE BESYLATE 5 MG PO TABS
5.0000 mg | ORAL_TABLET | Freq: Every day | ORAL | Status: DC
  Administered 2014-01-28 – 2014-01-30 (×3): 5 mg via ORAL
  Filled 2014-01-28 (×3): qty 1

## 2014-01-28 MED ORDER — LIDOCAINE HCL (CARDIAC) 20 MG/ML IV SOLN
INTRAVENOUS | Status: AC
  Filled 2014-01-28: qty 5

## 2014-01-28 MED ORDER — ALBUMIN HUMAN 5 % IV SOLN
INTRAVENOUS | Status: DC | PRN
  Administered 2014-01-28: 09:00:00 via INTRAVENOUS

## 2014-01-28 MED ORDER — LIDOCAINE HCL 4 % MT SOLN
OROMUCOSAL | Status: DC | PRN
  Administered 2014-01-28: 4 mL via TOPICAL

## 2014-01-28 MED ORDER — SUCCINYLCHOLINE CHLORIDE 20 MG/ML IJ SOLN
INTRAMUSCULAR | Status: AC
  Filled 2014-01-28: qty 1

## 2014-01-28 MED ORDER — HYDROMORPHONE HCL PF 1 MG/ML IJ SOLN
0.2500 mg | INTRAMUSCULAR | Status: DC | PRN
  Administered 2014-01-28 (×2): 0.25 mg via INTRAVENOUS
  Administered 2014-01-28: 0.5 mg via INTRAVENOUS

## 2014-01-28 MED ORDER — LACTATED RINGERS IV SOLN
INTRAVENOUS | Status: DC | PRN
  Administered 2014-01-28 (×2): via INTRAVENOUS

## 2014-01-28 MED ORDER — LIDOCAINE HCL (CARDIAC) 20 MG/ML IV SOLN
INTRAVENOUS | Status: DC | PRN
  Administered 2014-01-28: 60 mg via INTRAVENOUS

## 2014-01-28 MED ORDER — VITAMIN D 50 MCG (2000 UT) PO CAPS
2000.0000 [IU] | ORAL_CAPSULE | Freq: Every day | ORAL | Status: DC
Start: 2014-01-28 — End: 2014-01-28

## 2014-01-28 MED ORDER — CHLORHEXIDINE GLUCONATE 4 % EX LIQD: 60.0000 mL | Freq: Once | CUTANEOUS | Status: DC

## 2014-01-28 MED ORDER — ACETAMINOPHEN 650 MG RE SUPP: 650.0000 mg | Freq: Four times a day (QID) | RECTAL | Status: DC | PRN

## 2014-01-28 MED ORDER — GLYCOPYRROLATE 0.2 MG/ML IJ SOLN
INTRAMUSCULAR | Status: AC
  Filled 2014-01-28: qty 3

## 2014-01-28 MED ORDER — ONDANSETRON HCL 4 MG/2ML IJ SOLN
INTRAMUSCULAR | Status: AC
  Filled 2014-01-28: qty 2

## 2014-01-28 MED ORDER — ONDANSETRON HCL 4 MG/2ML IJ SOLN
INTRAMUSCULAR | Status: DC | PRN
  Administered 2014-01-28: 4 mg via INTRAVENOUS

## 2014-01-28 MED ORDER — ESTRADIOL 1 MG PO TABS
1.0000 mg | ORAL_TABLET | Freq: Every day | ORAL | Status: DC
  Administered 2014-01-28 – 2014-01-30 (×3): 1 mg via ORAL
  Filled 2014-01-28 (×3): qty 1

## 2014-01-28 MED ORDER — HYDROCHLOROTHIAZIDE 25 MG PO TABS
12.5000 mg | ORAL_TABLET | Freq: Every day | ORAL | Status: DC
  Administered 2014-01-28: 12.5 mg via ORAL
  Filled 2014-01-28 (×2): qty 0.5

## 2014-01-28 MED ORDER — MENTHOL 3 MG MT LOZG: 1.0000 | LOZENGE | OROMUCOSAL | Status: DC | PRN

## 2014-01-28 MED ORDER — METHOCARBAMOL 500 MG PO TABS
500.0000 mg | ORAL_TABLET | Freq: Four times a day (QID) | ORAL | Status: DC | PRN
  Administered 2014-01-28 – 2014-01-30 (×2): 500 mg via ORAL
  Filled 2014-01-28 (×3): qty 1

## 2014-01-28 MED ORDER — DEXAMETHASONE SODIUM PHOSPHATE 10 MG/ML IJ SOLN
INTRAMUSCULAR | Status: DC | PRN
  Administered 2014-01-28: 8 mg via INTRAVENOUS

## 2014-01-28 MED ORDER — NEOSTIGMINE METHYLSULFATE 10 MG/10ML IV SOLN
INTRAVENOUS | Status: DC | PRN
  Administered 2014-01-28: 4 mg via INTRAVENOUS

## 2014-01-28 MED ORDER — FENTANYL CITRATE 0.05 MG/ML IJ SOLN
INTRAMUSCULAR | Status: AC
  Filled 2014-01-28: qty 5

## 2014-01-28 MED ORDER — ASPIRIN EC 325 MG PO TBEC
325.0000 mg | DELAYED_RELEASE_TABLET | Freq: Two times a day (BID) | ORAL | Status: DC
  Administered 2014-01-29 – 2014-01-30 (×3): 325 mg via ORAL
  Filled 2014-01-28 (×5): qty 1

## 2014-01-28 MED ORDER — PROPOFOL 10 MG/ML IV BOLUS
INTRAVENOUS | Status: AC
  Filled 2014-01-28: qty 20

## 2014-01-28 MED ORDER — ONDANSETRON HCL 4 MG PO TABS: 4.0000 mg | ORAL_TABLET | Freq: Four times a day (QID) | ORAL | Status: DC | PRN

## 2014-01-28 MED ORDER — PROPOFOL 10 MG/ML IV BOLUS
INTRAVENOUS | Status: DC | PRN
  Administered 2014-01-28: 180 mg via INTRAVENOUS

## 2014-01-28 MED ORDER — FENTANYL CITRATE 0.05 MG/ML IJ SOLN
INTRAMUSCULAR | Status: DC | PRN
  Administered 2014-01-28: 50 ug via INTRAVENOUS
  Administered 2014-01-28: 100 ug via INTRAVENOUS
  Administered 2014-01-28: 50 ug via INTRAVENOUS
  Administered 2014-01-28: 150 ug via INTRAVENOUS

## 2014-01-28 MED ORDER — METHOCARBAMOL 1000 MG/10ML IJ SOLN
500.0000 mg | Freq: Four times a day (QID) | INTRAMUSCULAR | Status: DC | PRN
  Filled 2014-01-28: qty 5

## 2014-01-28 MED ORDER — VITAMIN D3 25 MCG (1000 UNIT) PO TABS
2000.0000 [IU] | ORAL_TABLET | Freq: Every day | ORAL | Status: DC
  Administered 2014-01-28 – 2014-01-30 (×3): 2000 [IU] via ORAL
  Filled 2014-01-28 (×3): qty 2

## 2014-01-28 MED ORDER — CEFAZOLIN SODIUM-DEXTROSE 2-3 GM-% IV SOLR
2.0000 g | Freq: Four times a day (QID) | INTRAVENOUS | Status: AC
  Administered 2014-01-28 (×2): 2 g via INTRAVENOUS
  Filled 2014-01-28 (×2): qty 50

## 2014-01-28 MED ORDER — HYDROCODONE-ACETAMINOPHEN 7.5-325 MG PO TABS
1.0000 | ORAL_TABLET | ORAL | Status: DC | PRN
  Administered 2014-01-28 – 2014-01-30 (×8): 2 via ORAL
  Filled 2014-01-28 (×8): qty 2

## 2014-01-28 MED ORDER — OXYCODONE HCL 5 MG PO TABS: 5.0000 mg | ORAL_TABLET | Freq: Once | ORAL | Status: DC | PRN

## 2014-01-28 MED ORDER — ONDANSETRON HCL 4 MG/2ML IJ SOLN: 4.0000 mg | Freq: Four times a day (QID) | INTRAMUSCULAR | Status: DC | PRN

## 2014-01-28 MED ORDER — OXYCODONE HCL 5 MG/5ML PO SOLN: 5.0000 mg | Freq: Once | ORAL | Status: DC | PRN

## 2014-01-28 MED ORDER — PHENYLEPHRINE 40 MCG/ML (10ML) SYRINGE FOR IV PUSH (FOR BLOOD PRESSURE SUPPORT)
PREFILLED_SYRINGE | INTRAVENOUS | Status: AC
  Filled 2014-01-28: qty 10

## 2014-01-28 MED ORDER — METHOCARBAMOL 1000 MG/10ML IJ SOLN
500.0000 mg | INTRAVENOUS | Status: AC
  Administered 2014-01-28: 500 mg via INTRAVENOUS
  Filled 2014-01-28: qty 5

## 2014-01-28 MED ORDER — LACTATED RINGERS IV SOLN
INTRAVENOUS | Status: DC
  Administered 2014-01-28: 15:00:00 via INTRAVENOUS

## 2014-01-28 MED ORDER — BISACODYL 5 MG PO TBEC: 5.0000 mg | DELAYED_RELEASE_TABLET | Freq: Every day | ORAL | Status: DC | PRN

## 2014-01-28 MED ORDER — HYDROMORPHONE HCL PF 1 MG/ML IJ SOLN
INTRAMUSCULAR | Status: AC
  Filled 2014-01-28: qty 1

## 2014-01-28 MED ORDER — MUPIROCIN CALCIUM 2 % NA OINT
1.0000 "application " | TOPICAL_OINTMENT | Freq: Two times a day (BID) | NASAL | Status: DC
  Administered 2014-01-28 – 2014-01-30 (×5): 1 via NASAL
  Filled 2014-01-28 (×32): qty 1

## 2014-01-28 MED ORDER — TRANEXAMIC ACID 100 MG/ML IV SOLN
1000.0000 mg | INTRAVENOUS | Status: AC
  Administered 2014-01-28: 1000 mg via INTRAVENOUS
  Filled 2014-01-28: qty 10

## 2014-01-28 MED ORDER — NEOSTIGMINE METHYLSULFATE 10 MG/10ML IV SOLN
INTRAVENOUS | Status: AC
  Filled 2014-01-28: qty 1

## 2014-01-28 MED ORDER — DOCUSATE SODIUM 100 MG PO CAPS
100.0000 mg | ORAL_CAPSULE | Freq: Two times a day (BID) | ORAL | Status: DC
  Administered 2014-01-28 – 2014-01-30 (×4): 100 mg via ORAL
  Filled 2014-01-28 (×6): qty 1

## 2014-01-28 MED ORDER — METOCLOPRAMIDE HCL 5 MG/ML IJ SOLN
INTRAMUSCULAR | Status: AC
  Filled 2014-01-28: qty 2

## 2014-01-28 MED ORDER — ALUM & MAG HYDROXIDE-SIMETH 200-200-20 MG/5ML PO SUSP
30.0000 mL | ORAL | Status: DC | PRN
  Administered 2014-01-29: 30 mL via ORAL
  Filled 2014-01-28: qty 30

## 2014-01-28 MED ORDER — HYDROMORPHONE HCL PF 1 MG/ML IJ SOLN
0.5000 mg | INTRAMUSCULAR | Status: DC | PRN
  Administered 2014-01-29: 1 mg via INTRAVENOUS
  Filled 2014-01-28 (×2): qty 1

## 2014-01-28 MED ORDER — METOCLOPRAMIDE HCL 5 MG/ML IJ SOLN
INTRAMUSCULAR | Status: DC | PRN
  Administered 2014-01-28: 10 mg via INTRAVENOUS

## 2014-01-28 MED ORDER — ROCURONIUM BROMIDE 50 MG/5ML IV SOLN
INTRAVENOUS | Status: AC
  Filled 2014-01-28: qty 1

## 2014-01-28 MED ORDER — METOPROLOL TARTRATE 50 MG PO TABS
50.0000 mg | ORAL_TABLET | Freq: Two times a day (BID) | ORAL | Status: DC
  Administered 2014-01-28 – 2014-01-30 (×5): 50 mg via ORAL
  Filled 2014-01-28 (×7): qty 1

## 2014-01-28 MED ORDER — ACETAMINOPHEN 325 MG PO TABS: 650.0000 mg | ORAL_TABLET | Freq: Four times a day (QID) | ORAL | Status: DC | PRN

## 2014-01-28 MED ORDER — DIPHENHYDRAMINE HCL 12.5 MG/5ML PO ELIX: 12.5000 mg | ORAL_SOLUTION | ORAL | Status: DC | PRN

## 2014-01-28 MED ORDER — POTASSIUM CHLORIDE CRYS ER 10 MEQ PO TBCR
10.0000 meq | EXTENDED_RELEASE_TABLET | Freq: Two times a day (BID) | ORAL | Status: DC
  Administered 2014-01-28 – 2014-01-30 (×5): 10 meq via ORAL
  Filled 2014-01-28 (×6): qty 1

## 2014-01-28 MED ORDER — DEXAMETHASONE SODIUM PHOSPHATE 10 MG/ML IJ SOLN
INTRAMUSCULAR | Status: AC
  Filled 2014-01-28: qty 1

## 2014-01-28 MED ORDER — GLYCOPYRROLATE 0.2 MG/ML IJ SOLN
INTRAMUSCULAR | Status: DC | PRN
  Administered 2014-01-28: 0.6 mg via INTRAVENOUS

## 2014-01-28 SURGICAL SUPPLY — 50 items
BLADE SAW SGTL 18X1.27X75 (BLADE) ×2 IMPLANT
BLADE SAW SGTL 18X1.27X75MM (BLADE) ×1
BLADE SURG ROTATE 9660 (MISCELLANEOUS) IMPLANT
CAPT HIP PF MOP ×2 IMPLANT
CELLS DAT CNTRL 66122 CELL SVR (MISCELLANEOUS) ×1 IMPLANT
COVER PERINEAL POST (MISCELLANEOUS) ×3 IMPLANT
COVER SURGICAL LIGHT HANDLE (MISCELLANEOUS) ×3 IMPLANT
DRAPE C-ARM 42X72 X-RAY (DRAPES) ×3 IMPLANT
DRAPE STERI IOBAN 125X83 (DRAPES) ×3 IMPLANT
DRAPE U-SHAPE 47X51 STRL (DRAPES) ×9 IMPLANT
DRSG AQUACEL AG ADV 3.5X10 (GAUZE/BANDAGES/DRESSINGS) ×3 IMPLANT
DURAPREP 26ML APPLICATOR (WOUND CARE) ×3 IMPLANT
ELECT BLADE 4.0 EZ CLEAN MEGAD (MISCELLANEOUS)
ELECT CAUTERY BLADE 6.4 (BLADE) ×3 IMPLANT
ELECT REM PT RETURN 9FT ADLT (ELECTROSURGICAL) ×3
ELECTRODE BLDE 4.0 EZ CLN MEGD (MISCELLANEOUS) IMPLANT
ELECTRODE REM PT RTRN 9FT ADLT (ELECTROSURGICAL) ×1 IMPLANT
FACESHIELD WRAPAROUND (MASK) ×6 IMPLANT
FACESHIELD WRAPAROUND OR TEAM (MASK) ×2 IMPLANT
GLOVE BIO SURGEON STRL SZ8 (GLOVE) ×9 IMPLANT
GLOVE BIOGEL PI IND STRL 8 (GLOVE) ×1 IMPLANT
GLOVE BIOGEL PI INDICATOR 8 (GLOVE) ×2
GLOVE SS BIOGEL STRL SZ 8 (GLOVE) ×2 IMPLANT
GLOVE SUPERSENSE BIOGEL SZ 8 (GLOVE) ×4
GOWN STRL REUS W/ TWL LRG LVL3 (GOWN DISPOSABLE) ×2 IMPLANT
GOWN STRL REUS W/ TWL XL LVL3 (GOWN DISPOSABLE) ×1 IMPLANT
GOWN STRL REUS W/TWL LRG LVL3 (GOWN DISPOSABLE) ×6
GOWN STRL REUS W/TWL XL LVL3 (GOWN DISPOSABLE) ×3
KIT BASIN OR (CUSTOM PROCEDURE TRAY) ×3 IMPLANT
KIT ROOM TURNOVER OR (KITS) ×3 IMPLANT
LINER BOOT UNIVERSAL DISP (MISCELLANEOUS) ×3 IMPLANT
MANIFOLD NEPTUNE II (INSTRUMENTS) ×3 IMPLANT
NS IRRIG 1000ML POUR BTL (IV SOLUTION) ×3 IMPLANT
PACK TOTAL JOINT (CUSTOM PROCEDURE TRAY) ×3 IMPLANT
PAD ARMBOARD 7.5X6 YLW CONV (MISCELLANEOUS) ×6 IMPLANT
RETRACTOR WND ALEXIS 18 MED (MISCELLANEOUS) ×1 IMPLANT
RTRCTR WOUND ALEXIS 18CM MED (MISCELLANEOUS) ×3
STAPLER VISISTAT 35W (STAPLE) ×3 IMPLANT
SUT ETHIBOND NAB CT1 #1 30IN (SUTURE) ×6 IMPLANT
SUT VIC AB 0 CT1 27 (SUTURE)
SUT VIC AB 0 CT1 27XBRD ANBCTR (SUTURE) IMPLANT
SUT VIC AB 1 CT1 27 (SUTURE) ×3
SUT VIC AB 1 CT1 27XBRD ANBCTR (SUTURE) ×1 IMPLANT
SUT VIC AB 2-0 CT1 27 (SUTURE) ×3
SUT VIC AB 2-0 CT1 TAPERPNT 27 (SUTURE) ×1 IMPLANT
SUT VLOC 180 0 24IN GS25 (SUTURE) ×3 IMPLANT
TOWEL OR 17X24 6PK STRL BLUE (TOWEL DISPOSABLE) ×3 IMPLANT
TOWEL OR 17X26 10 PK STRL BLUE (TOWEL DISPOSABLE) ×6 IMPLANT
TRAY FOLEY CATH 14FR (SET/KITS/TRAYS/PACK) IMPLANT
WATER STERILE IRR 1000ML POUR (IV SOLUTION) ×6 IMPLANT

## 2014-01-28 NOTE — Anesthesia Preprocedure Evaluation (Addendum)
Anesthesia Evaluation  Patient identified by MRN, date of birth, ID band Patient awake    Reviewed: Allergy & Precautions, H&P , NPO status , Patient's Chart, lab work & pertinent test results, reviewed documented beta blocker date and time   Airway Mallampati: II TM Distance: >3 FB Neck ROM: full    Dental  (+) Edentulous Upper, Edentulous Lower, Dental Advisory Given   Pulmonary pneumonia -, resolved, former smoker,  breath sounds clear to auscultation        Cardiovascular hypertension, On Medications and On Home Beta Blockers Rhythm:regular     Neuro/Psych negative neurological ROS  negative psych ROS   GI/Hepatic negative GI ROS, Neg liver ROS,   Endo/Other  negative endocrine ROS  Renal/GU Renal disease  negative genitourinary   Musculoskeletal   Abdominal (+)  Abdomen: soft. Bowel sounds: normal.  Peds  Hematology negative hematology ROS (+)   Anesthesia Other Findings See surgeon's H&P   Reproductive/Obstetrics negative OB ROS                        Anesthesia Physical Anesthesia Plan  ASA: II  Anesthesia Plan: General   Post-op Pain Management:    Induction: Intravenous  Airway Management Planned: Oral ETT  Additional Equipment:   Intra-op Plan:   Post-operative Plan: Extubation in OR  Informed Consent: I have reviewed the patients History and Physical, chart, labs and discussed the procedure including the risks, benefits and alternatives for the proposed anesthesia with the patient or authorized representative who has indicated his/her understanding and acceptance.   Dental Advisory Given  Plan Discussed with: CRNA and Surgeon  Anesthesia Plan Comments:         Anesthesia Quick Evaluation

## 2014-01-28 NOTE — Transfer of Care (Signed)
Immediate Anesthesia Transfer of Care Note  Patient: Ashley Lamb  Procedure(s) Performed: Procedure(s): LEFT TOTAL HIP ARTHROPLASTY ANTERIOR APPROACH (Left)  Patient Location: PACU  Anesthesia Type:General  Level of Consciousness: awake, alert  and oriented  Airway & Oxygen Therapy: Patient connected to face mask oxygen  Post-op Assessment: Report given to PACU RN  Post vital signs: stable  Complications: No apparent anesthesia complications

## 2014-01-28 NOTE — Anesthesia Procedure Notes (Signed)
Procedure Name: Intubation Date/Time: 01/28/2014 7:33 AM Performed by: Maeola Harman Pre-anesthesia Checklist: Patient identified, Emergency Drugs available, Suction available, Patient being monitored and Timeout performed Patient Re-evaluated:Patient Re-evaluated prior to inductionOxygen Delivery Method: Circle system utilized Preoxygenation: Pre-oxygenation with 100% oxygen Intubation Type: IV induction Ventilation: Mask ventilation without difficulty and Oral airway inserted - appropriate to patient size Laryngoscope Size: Mac and 3 Grade View: Grade I Tube type: Oral Tube size: 7.5 mm Number of attempts: 1 Airway Equipment and Method: Stylet Placement Confirmation: ETT inserted through vocal cords under direct vision,  positive ETCO2 and breath sounds checked- equal and bilateral Secured at: 22 cm Tube secured with: Tape Dental Injury: Teeth and Oropharynx as per pre-operative assessment  Comments: Easy atraumatic induction and intubation with MAC 3 blade.  Dr. Albertina Parr verified placement of ETT.  Waldron Session, CRNA

## 2014-01-28 NOTE — Care Management Utilization Note (Signed)
Utilization review completed. Susan  Brady, RN BSN Case Manager 

## 2014-01-28 NOTE — Progress Notes (Signed)
Orthopedic Tech Progress Note Patient Details:  Ashley Lamb [facility] 1941/07/04 [id number]  Ortho Devices Ortho Device/Splint Location: applied overhead frame to bed Ortho Device/Splint Interventions: Ordered;Application   Braulio Bosch 01/28/2014, 8:21 PM

## 2014-01-28 NOTE — Care Management Note (Signed)
CARE MANAGEMENT NOTE 01/28/2014  Patient:  Ashley Lamb, Ashley Lamb   Account Number:  [id number]  Date Initiated:  01/28/2014  Documentation initiated by:  Ricki Miller  Subjective/Objective Assessment:   73 yr old female s/p left total hip arthroplasty     Action/Plan:   PT/OT eval.  Case manager to continue to monitor.   Anticipated DC Date:     Anticipated DC Plan:  [facility]  CM consult      [facility] Choice  HOME HEALTH  DURABLE MEDICAL EQUIPMENT   Choice offered to / List presented to:             Status of service:  In process, will continue to follow

## 2014-01-28 NOTE — Evaluation (Signed)
Physical Therapy Evaluation Patient Details Name: Ashley Lamb MRN: [id number] DOB: [birth year] Today's Date: 01/28/2014   History of Present Illness  73 y.o. female s/p left total hip arthroplasty via direct anterior approach. Hx of HTN.  Clinical Impression  Pt is s/p left THA resulting in the deficits listed below (see PT Problem List). Ambulating generally well up to 40 feet while using rolling walker at min guard level for safety. Pt will benefit from skilled PT to increase their independence and safety with mobility to allow discharge to the venue listed below. Anticipate pt will progress quickly and she plans to d/c home with 24 hour family care.       Follow Up Recommendations Home health PT;Supervision for mobility/OOB    Equipment Recommendations  Rolling walker with 5" wheels;3in1 (PT)    Recommendations for Other Services OT consult     Precautions / Restrictions Precautions Precautions: None Restrictions Weight Bearing Restrictions: Yes LLE Weight Bearing: Weight bearing as tolerated      Mobility  Bed Mobility Overal bed mobility: Needs Assistance Bed Mobility: Supine to Sit     Supine to sit: Supervision     General bed mobility comments: Supervision for safety with supine>sit. HOB flat. VCs for technique. No physical assist needed  Transfers Overall transfer level: Needs assistance Equipment used: Rolling walker (2 wheeled) Transfers: Sit to/from Stand Sit to Stand: Min guard         General transfer comment: Min guard for safety from lowest bed setting, no physical assist required. VCs for hand placement. Requires extra time. Fair control with descent into chair.  Ambulation/Gait Ambulation/Gait assistance: Min guard Ambulation Distance (Feet): 40 Feet Assistive device: Rolling walker (2 wheeled) Gait Pattern/deviations: Step-to pattern;Decreased step length - right;Decreased stance time - left;Antalgic   Gait velocity interpretation: Below  normal speed for age/gender General Gait Details: Educated on safe DME use. VCs for sequencing of gait and walker placement. No loss of balance during ambulatory bout.  Stairs            Wheelchair Mobility    Modified Rankin (Stroke Patients Only)       Balance Overall balance assessment: Needs assistance Sitting-balance support: No upper extremity supported;Feet supported Sitting balance-Leahy Scale: Good     Standing balance support: Single extremity supported Standing balance-Leahy Scale: Poor                               Pertinent Vitals/Pain 1/10 pain Patient repositioned in chair for comfort.     Home Living Family/patient expects to be discharged to:: Private residence Living Arrangements: Spouse/significant other;Children Available Help at Discharge: Family;Available 24 hours/day Type of Home: House Home Access: Level entry     Home Layout: One level Home Equipment: Cane - single point;Shower seat      Prior Function Level of Independence: Independent with assistive device(s)         Comments: Used cane for ambulation. States she could bathe and dress herself     Hand Dominance   Dominant Hand: Right    Extremity/Trunk Assessment   Upper Extremity Assessment: Defer to OT evaluation           Lower Extremity Assessment: LLE deficits/detail   LLE Deficits / Details: Decreased strength and ROM - as expected post op THA     Communication   Communication: No difficulties  Cognition Arousal/Alertness: Awake/alert Behavior During Therapy: WFL for tasks assessed/performed Overall Cognitive  Status: Within Functional Limits for tasks assessed                      General Comments General comments (skin integrity, edema, etc.): Educated on use of incentive spirometer.    Exercises Total Joint Exercises Ankle Circles/Pumps: AROM;Both;10 reps;Seated Quad Sets: AROM;Both;10 reps;Seated      Assessment/Plan     PT Assessment Patient needs continued PT services  PT Diagnosis Difficulty walking;Abnormality of gait;Acute pain   PT Problem List Decreased strength;Decreased range of motion;Decreased activity tolerance;Decreased balance;Decreased mobility;Decreased knowledge of use of DME;Pain  PT Treatment Interventions DME instruction;Gait training;Functional mobility training;Therapeutic activities;Therapeutic exercise;Balance training;Neuromuscular re-education;Patient/family education;Modalities   PT Goals (Current goals can be found in the Care Plan section) Acute Rehab PT Goals Patient Stated Goal: None stated PT Goal Formulation: With patient Time For Goal Achievement: 01/28/14 Potential to Achieve Goals: Good    Frequency 7X/week   Barriers to discharge        Co-evaluation               End of Session   Activity Tolerance: Patient tolerated treatment well Patient left: in chair;with call bell/phone within reach Nurse Communication: Mobility status         Time: 3785-8850 PT Time Calculation (min): 29 min   Charges:   PT Evaluation $Initial PT Evaluation Tier I: 1 Procedure PT Treatments $Gait Training: 8-22 mins   PT G Codes:         [employer], [place]  Ashley Lamb 01/28/2014, 2:12 PM

## 2014-01-28 NOTE — Op Note (Signed)
PRE-OP DIAGNOSIS:  LEFT HIP DEGENERATIVE JOINT DISEASE POST-OP DIAGNOSIS: same PROCEDURE:  LEFT TOTAL HIP ARTHROPLASTY ANTERIOR APPROACH ANESTHESIA:  General SURGEON:  Melrose Nakayama MD ASSISTANT:  Loni Dolly PA-C   INDICATIONS FOR PROCEDURE:  The patient is a 73 y.o. female with a long history of a painful hip.  This has persisted despite multiple conservative measures.  The patient has persisted with pain and dysfunction making rest and activity difficult.  A total hip replacement is offered as surgical treatment.  Informed operative consent was obtained after discussion of possible complications including reaction to anesthesia, infection, neurovascular injury, dislocation, DVT, PE, and death.  The importance of the postoperative rehab program to optimize result was stressed with the patient.  SUMMARY OF FINDINGS AND PROCEDURE:  Under general anesthesia through a anterior approach an the Hana table a left THR was performed.  The patient had severe degenerative change and good bone quality.  We used DePuy components to replace the hip and these were size KA 12 Corail femur capped with a +8.5 48mm hip ball.  On the acetabular side we used a size 52 Gription shell with a plus 4 neutral polyethylene liner.  We did use a hole eliminator.  Loni Dolly PA-C assisted throughout and was invaluable to the completion of the case in that he helped position and retract while I performed the procedure.  He also closed simultaneously to help minimize OR time.  I used fluoroscopy throughout the case to check position of components and leg lengths and read all these views myself.  DESCRIPTION OF PROCEDURE:  The patient was taken to the OR suite where general anesthetic was applied.  The patient was then positioned on the Hana table supine.  All bony prominences were appropriately padded.  Prep and drape was then performed in normal sterile fashion.  The patient was given Kefzol preoperative antibiotic and an  appropriate time out was performed.  We then took an anterior approach to the right hip.  Dissection was taken through adipose to the tensor fascia lata fascia.  This structure was incised longitudinally and we dissected in the intermuscular interval just medial to this muscle.  Cobra retractors were placed superior and inferior to the femoral neck superficial to the capsule.  A capsular incision was then made and the retractors were placed along the femoral neck.  Xray was brought in to get a good level for the femoral neck cut which was made with an oscillating saw and osteotome.  The femoral head was removed with a corkscrew.  The acetabulum was exposed and some labral tissues were excised. Reaming was taken to the inside wall of the pelvis and sequentially up to 1 mm smaller than the actual component.  A trial of components was done and then the aforementioned acetabular shell was placed in appropriate tilt and anteversion confirmed by fluoroscopy. The liner was placed along with the hole eliminator and attention was turned to the femur.  The leg was brought down and over into adduction and the elevator bar was used to raise the femur up gently in the wound.  The piriformis was released with care taken to preserve the obturator internus attachment and all of the posterior capsule. The femur was reamed and then broached to the appropriate size.  A trial reduction was done and the aforementioned head and neck assembly gave [place] the best stability in extension with external rotation.  Leg lengths were felt to be about equal by fluoroscopic exam.  The trial  components were removed and the wound irrigated.  We then placed the femoral component in appropriate anteversion.  The head was applied to a dry stem neck and the hip again reduced.  It was again stable in the aforementioned position.  The would was irrigated again followed by re-approximation of anterior capsule with ethibond suture. Tensor fascia was repaired  with V-loc suture  followed by subcutaneous closure with #O and #2 undyed vicryl.  Skin was closed with staples followed by a sterile dressing.  EBL and IOF can be obtained from anesthesia records.  DISPOSITION:  The patient was extubated in the OR and taken to PACU in stable condition to be admitted to the Orthopedic Surgery for appropriate post-op care to include perioperative antibiotics and DVT prophylaxis.

## 2014-01-28 NOTE — Interval H&P Note (Signed)
History and Physical Interval Note:  01/28/2014 7:20 AM  Ashley Lamb  has presented today for surgery, with the diagnosis of OSTEOARTHRITIS LEFT HIP  The various methods of treatment have been discussed with the patient and family. After consideration of risks, benefits and other options for treatment, the patient has consented to  Procedure(s): LEFT TOTAL HIP ARTHROPLASTY ANTERIOR APPROACH (Left) as a surgical intervention .  The patient's history has been reviewed, patient examined, no change in status, stable for surgery.  I have reviewed the patient's chart and labs.  Questions were answered to the patient's satisfaction.     DALLDORF,PETER G

## 2014-01-28 NOTE — Anesthesia Postprocedure Evaluation (Signed)
Anesthesia Post Note  Patient: Ashley Lamb  Procedure(s) Performed: Procedure(s) (LRB): LEFT TOTAL HIP ARTHROPLASTY ANTERIOR APPROACH (Left)  Anesthesia type: General  Patient location: PACU  Post pain: Pain level controlled  Post assessment: Patient's Cardiovascular Status Stable  Last Vitals:  Filed Vitals:   01/28/14 1045  BP: 104/62  Pulse: 53  Temp:   Resp: 10    Post vital signs: Reviewed and stable  Level of consciousness: alert  Complications: No apparent anesthesia complications

## 2014-01-29 ENCOUNTER — Encounter (HOSPITAL_COMMUNITY): Payer: Self-pay | Admitting: Orthopaedic Surgery

## 2014-01-29 LAB — URINALYSIS, ROUTINE W REFLEX MICROSCOPIC
BILIRUBIN URINE: NEGATIVE
GLUCOSE, UA: NEGATIVE mg/dL
HGB URINE DIPSTICK: NEGATIVE
Ketones, ur: NEGATIVE mg/dL
Leukocytes, UA: NEGATIVE
Nitrite: NEGATIVE
PH: 5 (ref 5.0–8.0)
Protein, ur: NEGATIVE mg/dL
SPECIFIC GRAVITY, URINE: 1.017 (ref 1.005–1.030)
Urobilinogen, UA: 0.2 mg/dL (ref 0.0–1.0)

## 2014-01-29 LAB — CBC
HEMATOCRIT: 26.3 % — AB (ref 36.0–46.0)
Hemoglobin: 8.7 g/dL — ABNORMAL LOW (ref 12.0–15.0)
MCH: 31.5 pg (ref 26.0–34.0)
MCHC: 33.1 g/dL (ref 30.0–36.0)
MCV: 95.3 fL (ref 78.0–100.0)
Platelets: 167 10*3/uL (ref 150–400)
RBC: 2.76 MIL/uL — ABNORMAL LOW (ref 3.87–5.11)
RDW: 14.4 % (ref 11.5–15.5)
WBC: 13 10*3/uL — ABNORMAL HIGH (ref 4.0–10.5)

## 2014-01-29 LAB — BASIC METABOLIC PANEL
BUN: 18 mg/dL (ref 6–23)
CALCIUM: 8.3 mg/dL — AB (ref 8.4–10.5)
CO2: 23 mEq/L (ref 19–32)
CREATININE: 1.11 mg/dL — AB (ref 0.50–1.10)
Chloride: 98 mEq/L (ref 96–112)
GFR, EST AFRICAN AMERICAN: 57 mL/min — AB (ref >90)
GFR, EST NON AFRICAN AMERICAN: 49 mL/min — AB (ref >90)
Glucose, Bld: 116 mg/dL — ABNORMAL HIGH (ref 70–99)
Potassium: 3.8 mEq/L (ref 3.7–5.3)
Sodium: 135 mEq/L — ABNORMAL LOW (ref 137–147)

## 2014-01-29 MED ORDER — BETHANECHOL CHLORIDE 10 MG PO TABS
10.0000 mg | ORAL_TABLET | Freq: Three times a day (TID) | ORAL | Status: DC
  Administered 2014-01-29 – 2014-01-30 (×2): 10 mg via ORAL
  Filled 2014-01-29 (×4): qty 1

## 2014-01-29 MED ORDER — HYDROCHLOROTHIAZIDE 12.5 MG PO CAPS
12.5000 mg | ORAL_CAPSULE | Freq: Every day | ORAL | Status: DC
  Administered 2014-01-29 – 2014-01-30 (×2): 12.5 mg via ORAL
  Filled 2014-01-29 (×2): qty 1

## 2014-01-29 NOTE — Progress Notes (Signed)
Subjective: 1 Day Post-Op Procedure(s) (LRB): LEFT TOTAL HIP ARTHROPLASTY ANTERIOR APPROACH (Left)  Activity level:  wbat Diet tolerance:  Eating well Voiding:  Unable to void yet Patient reports pain as mild.    Objective: Vital signs in last 24 hours: Temp:  [97.6 F (36.4 C)-98.8 F (37.1 C)] 97.9 F (36.6 C) (07/01 0521) Pulse Rate:  [53-68] 61 (07/01 0521) Resp:  [8-19] 18 (07/01 0521) BP: (102-152)/(55-75) 107/58 mmHg (07/01 0521) SpO2:  [94 %-100 %] 100 % (07/01 0521) Weight:  [95.255 kg (210 lb)] 95.255 kg (210 lb) (06/30 1931)  Labs:  Recent Labs  01/29/14 0546  HGB 8.7*    Recent Labs  01/29/14 0546  WBC 13.0*  RBC 2.76*  HCT 26.3*  PLT 167    Recent Labs  01/29/14 0546  NA 135*  K 3.8  CL 98  CO2 23  BUN 18  CREATININE 1.11*  GLUCOSE 116*  CALCIUM 8.3*   No results found for this basename: LABPT, INR,  in the last 72 hours  Physical Exam:  Neurologically intact ABD soft Neurovascular intact Sensation intact distally Intact pulses distally Dorsiflexion/Plantar flexion intact Incision: dressing C/D/I No cellulitis present Compartment soft  Assessment/Plan:  1 Day Post-Op Procedure(s) (LRB): LEFT TOTAL HIP ARTHROPLASTY ANTERIOR APPROACH (Left) Advance diet Up with therapy Plan for discharge tomorrow Discharge home with home health If patient is still unable to void we will in and out cath again and send for UA with culture and sensitivity. If UA is clear we will consider starting ditropan.  Continue ASA 325mg  BID x 4 weeks. Follow up in office 2 weeks post op.    Samika Vetsch, Larwance Sachs 01/29/2014, 8:32 AM

## 2014-01-29 NOTE — Progress Notes (Signed)
Physical Therapy Treatment Patient Details Name: Ashley Lamb MRN: [id number] DOB: 1940/11/12 Today's Date: 01/29/2014    History of Present Illness 73 y.o. female s/p left total hip arthroplasty via direct anterior approach. Hx of HTN.    PT Comments    Patient able to progress with ambulation this session. Mobilizing well. Patient is planning to DC tomorrow morning. Will continue with current POC  Follow Up Recommendations  Home health PT;Supervision for mobility/OOB     Equipment Recommendations  Rolling walker with 5" wheels;3in1 (PT)    Recommendations for Other Services       Precautions / Restrictions Precautions Precautions: None Restrictions LLE Weight Bearing: Weight bearing as tolerated    Mobility  Bed Mobility   Bed Mobility: Supine to Sit     Supine to sit: Supervision     General bed mobility comments: Required increased effort but no physical assist  Transfers Overall transfer level: Needs assistance Equipment used: Rolling walker (2 wheeled)   Sit to Stand: Min guard         General transfer comment: Cues for safe hand placement and technique  Ambulation/Gait Ambulation/Gait assistance: Min guard Ambulation Distance (Feet): 180 Feet Assistive device: Rolling walker (2 wheeled) Gait Pattern/deviations: Step-through pattern;Decreased stride length Gait velocity: decreased   General Gait Details: Cues for upright posture. Safe use of RW   Stairs            Wheelchair Mobility    Modified Rankin (Stroke Patients Only)       Balance                                    Cognition Arousal/Alertness: Awake/alert Behavior During Therapy: WFL for tasks assessed/performed Overall Cognitive Status: Within Functional Limits for tasks assessed                      Exercises Total Joint Exercises Quad Sets: AROM;Both;10 reps;Seated Heel Slides: AROM;Left;10 reps Hip ABduction/ADduction: AAROM;Left;10  reps Long Arc Quad: AROM;10 reps;Left    General Comments        Pertinent Vitals/Pain Denied pain    Home Living                      Prior Function            PT Goals (current goals can now be found in the care plan section) Progress towards PT goals: Progressing toward goals    Frequency  7X/week    PT Plan Current plan remains appropriate    Co-evaluation             End of Session   Activity Tolerance: Patient tolerated treatment well Patient left: in chair;with call bell/phone within reach     Time: 0736-0800 PT Time Calculation (min): 24 min  Charges:  $Gait Training: 8-22 mins $Therapeutic Exercise: 8-22 mins                    G Codes:      Jacqualyn Posey 01/29/2014, 10:19 AM 01/29/2014 Jacqualyn Posey PTA [phone] pager [phone] office

## 2014-01-29 NOTE — Progress Notes (Signed)
Physical Therapy Treatment Patient Details Name: Ashley Lamb MRN: [id number] DOB: Feb 23, 1941 Today's Date: 01/29/2014    History of Present Illness 73 y.o. female s/p left total hip arthroplasty via direct anterior approach. Hx of HTN and previous back sx.    PT Comments    Patient continues to progress well with ambulation. Husband present and educated on her mobility. Will continue with current POC. Patient not discharging til tomorrow due to urinary issues.   Follow Up Recommendations  Home health PT;Supervision for mobility/OOB     Equipment Recommendations  Rolling walker with 5" wheels;3in1 (PT)    Recommendations for Other Services       Precautions / Restrictions Precautions Precautions: None Restrictions LLE Weight Bearing: Weight bearing as tolerated    Mobility  Bed Mobility   Bed Mobility: Supine to Sit     Supine to sit: Supervision     General bed mobility comments: Pt up in chair  Transfers Overall transfer level: Needs assistance Equipment used: Rolling walker (2 wheeled) Transfers: Sit to/from Stand Sit to Stand: Min guard         General transfer comment: Pt had good hand placement and required no cues.  Ambulation/Gait Ambulation/Gait assistance: Min guard Ambulation Distance (Feet): 200 Feet Assistive device: Rolling walker (2 wheeled) Gait Pattern/deviations: Step-through pattern;Decreased stride length Gait velocity: decreased   General Gait Details: Cues for upright posture. Safe use of RW   Stairs            Wheelchair Mobility    Modified Rankin (Stroke Patients Only)       Balance Overall balance assessment: Needs assistance Sitting-balance support: No upper extremity supported;Feet supported Sitting balance-Leahy Scale: Good     Standing balance support: Single extremity supported Standing balance-Leahy Scale: Poor                      Cognition Arousal/Alertness: Awake/alert Behavior During  Therapy: WFL for tasks assessed/performed Overall Cognitive Status: Within Functional Limits for tasks assessed                      Exercises Total Joint Exercises Quad Sets: AROM;Both;10 reps;Seated Heel Slides: AROM;Left;10 reps Hip ABduction/ADduction: AAROM;Left;10 reps Long Arc Quad: AROM;10 reps;Left    General Comments        Pertinent Vitals/Pain 5/10 Hip pain. RN provided medication to assist with pain control     Home Living Family/patient expects to be discharged to:: Private residence Living Arrangements: Spouse/significant other Available Help at Discharge: Family;Available 24 hours/day Type of Home: House Home Access: Level entry   Home Layout: One level Home Equipment: Cane - single point;Shower seat      Prior Function Level of Independence: Needs assistance  Gait / Transfers Assistance Needed: cane for ambulation ADL's / Homemaking Assistance Needed: required A for LLE dressing     PT Goals (current goals can now be found in the care plan section) Acute Rehab PT Goals Patient Stated Goal: None stated Progress towards PT goals: Progressing toward goals    Frequency  7X/week    PT Plan Current plan remains appropriate    Co-evaluation             End of Session Equipment Utilized During Treatment: Gait belt Activity Tolerance: Patient tolerated treatment well Patient left: in chair;with call bell/phone within reach     Time: 1140-1155 PT Time Calculation (min): 15 min  Charges:  $Gait Training: 8-22 mins $Therapeutic Exercise: 8-22 mins  G Codes:      Jacqualyn Posey 01/29/2014, 12:11 PM 01/29/2014 Jacqualyn Posey PTA [phone] pager [phone] office

## 2014-01-29 NOTE — Evaluation (Signed)
Agree with note. [phone] 1EV [place] 01/29/2014 Nestor Lewandowsky, OTR/L Pager: [phone]

## 2014-01-29 NOTE — Evaluation (Signed)
Occupational Therapy Evaluation and Discharge Patient Details Name: Ashley Lamb MRN: [id number] DOB: 1941/01/03 Today's Date: 01/29/2014    History of Present Illness 73 y.o. female s/p left total hip arthroplasty via direct anterior approach. Hx of HTN and previous back sx.   Clinical Impression   PTA pt required assistance for LLE LB dressing from above and now presents with limited ROM and acute pain interfering her with independence with ADLs. Educated pt on LB dressing/bathing technique and sequence, and available AE to assist with those tasks but pt declined saying she would have her husband continue to help with that until she was able to do so independently. Encouraged pt to attempt to get her LLE dressed everyday. Pt will have 24/7 supervision at home and has no questions regarding ADLs, therefore no further OT is needed, we will sign off.    Follow Up Recommendations  Supervision/Assistance - 24 hour    Equipment Recommendations  3 in 1 bedside comode (Rolling walker)       Precautions / Restrictions Precautions Precautions: None Restrictions LLE Weight Bearing: Weight bearing as tolerated      Mobility Bed Mobility   Bed Mobility: Supine to Sit     Supine to sit: Supervision     General bed mobility comments: Pt up in chair upon OT tx.  Transfers Overall transfer level: Needs assistance Equipment used: Rolling walker (2 wheeled) Transfers: Sit to/from Stand Sit to Stand: Min guard         General transfer comment: Pt had good hand placement and required no cues.    Balance Overall balance assessment: Needs assistance Sitting-balance support: No upper extremity supported;Feet supported Sitting balance-Leahy Scale: Good     Standing balance support: Single extremity supported Standing balance-Leahy Scale: Poor                              ADL Overall ADL's : Needs assistance/impaired Eating/Feeding: Independent;Sitting   Grooming: Set  up;Sitting   Upper Body Bathing: Set up;Sitting   Lower Body Bathing: Minimal assistance;Sit to/from stand   Upper Body Dressing : Set up;Sitting   Lower Body Dressing: Minimal assistance;Sit to/from stand   Toilet Transfer: Min guard;BSC;RW;Ambulation   Toileting- Water quality scientist and Hygiene: Min guard;Sit to/from stand       Functional mobility during ADLs: Min guard;Rolling walker General ADL Comments: Educated pt on LB bathing/dressing sequence and technique and available AE to A with those tasks, to prop LLE out when sitting and difference between using a RW vs a cane. Educated pt on available AE to assist with LB dressing/bathing but pt stated that she will have her husband help with her LLE until she is able to do it independently. Encouraged pt to try to dress her LLE everyday.               Pertinent Vitals/Pain Pt c/o of pain during tx but did not rate. Nurse was notified and pt was repositioned in chair.     Hand Dominance Right   Extremity/Trunk Assessment Upper Extremity Assessment Upper Extremity Assessment: Overall WFL for tasks assessed   Lower Extremity Assessment Lower Extremity Assessment: Defer to PT evaluation       Communication Communication Communication: No difficulties   Cognition Arousal/Alertness: Awake/alert Behavior During Therapy: WFL for tasks assessed/performed Overall Cognitive Status: Within Functional Limits for tasks assessed  Home Living Family/patient expects to be discharged to:: Private residence Living Arrangements: Spouse/significant other Available Help at Discharge: Family;Available 24 hours/day Type of Home: House Home Access: Level entry     Home Layout: One level         Bathroom Toilet: Standard     Home Equipment: Cane - single point;Shower seat          Prior Functioning/Environment Level of Independence: Needs assistance  Gait / Transfers Assistance  Needed: cane for ambulation ADL's / Homemaking Assistance Needed: required A for LLE dressing                 OT Goals(Current goals can be found in the care plan section) Acute Rehab OT Goals Patient Stated Goal: None stated   End of Session Equipment Utilized During Treatment: Rolling walker Nurse Communication: Patient requests pain meds  Activity Tolerance: Patient tolerated treatment well Patient left: in chair;with family/visitor present;with call bell/phone within reach   Time:  -    Charges:    G-CodesLyda Perone 2014-02-25, 11:52 AM

## 2014-01-29 NOTE — Care Management Note (Signed)
CARE MANAGEMENT NOTE 01/29/2014  Patient:  COLLEN, HOSTLER   Account Number:  [id number]  Date Initiated:  01/28/2014  Documentation initiated by:  Ricki Miller  Subjective/Objective Assessment:   73 yr old female s/p left total hip arthroplasty     Action/Plan:   PT/OT eval.  Case manager spoke with patient concerning home health and DME needs at discharge. Choice offered. Patient states she uses [facility]. wants to do so now. Referral called to [facility] [place].   Anticipated DC Date:  01/30/2014   Anticipated DC Plan:  [facility]  CM consult      [place]   Choice offered to / List presented to:  C-1 Patient   DME arranged  [place]  3-N-1      DME agency  [place] arranged  [place].   Status of service:  Completed, signed off Medicare Important Message given?  NA - LOS <3 / Initial given by admissions (If response is "NO", the following Medicare IM given date fields will be blank) Date Medicare IM given:  01/15/2014 Medicare IM given by:   Date Additional Medicare IM given:   Additional Medicare IM given by:    Discharge Disposition:  [place]  Per UR Regulation:  Reviewed for med. necessity/level of care/duration of stay

## 2014-01-29 NOTE — Clinical Social Work Note (Signed)
Referred to CSW today for ?SNF. Chart reviewed and have spoken with [facility] and Care Coordinator who indicate patient plans to d/c home with [facility] and DME. CSW to sign off- please contact us if SW needs arise. Eduard Clos, MSW, [place]

## 2014-01-30 LAB — URINE CULTURE
COLONY COUNT: NO GROWTH
Culture: NO GROWTH

## 2014-01-30 LAB — CBC
HCT: 24.6 % — ABNORMAL LOW (ref 36.0–46.0)
HEMOGLOBIN: 8.2 g/dL — AB (ref 12.0–15.0)
MCH: 31.2 pg (ref 26.0–34.0)
MCHC: 33.3 g/dL (ref 30.0–36.0)
MCV: 93.5 fL (ref 78.0–100.0)
Platelets: 137 10*3/uL — ABNORMAL LOW (ref 150–400)
RBC: 2.63 MIL/uL — ABNORMAL LOW (ref 3.87–5.11)
RDW: 14.3 % (ref 11.5–15.5)
WBC: 9 10*3/uL (ref 4.0–10.5)

## 2014-01-30 MED ORDER — METHOCARBAMOL 500 MG PO TABS: 500.0000 mg | ORAL_TABLET | Freq: Four times a day (QID) | ORAL | Status: DC | PRN

## 2014-01-30 MED ORDER — ASPIRIN 325 MG PO TBEC: 325.0000 mg | DELAYED_RELEASE_TABLET | Freq: Two times a day (BID) | ORAL | Status: DC

## 2014-01-30 MED ORDER — HYDROCODONE-ACETAMINOPHEN 7.5-325 MG PO TABS: 1.0000 | ORAL_TABLET | ORAL | Status: DC | PRN

## 2014-01-30 MED ORDER — BETHANECHOL CHLORIDE 10 MG PO TABS: 10.0000 mg | ORAL_TABLET | Freq: Three times a day (TID) | ORAL | Status: DC

## 2014-01-30 NOTE — Discharge Summary (Signed)
Patient ID: Ashley Lamb MRN: [id number] DOB/AGE: 1941/04/15 73 y.o.  Admit date: 01/28/2014 Discharge date: 01/30/2014  Admission Diagnoses:  Principal Problem:   Degenerative joint disease (DJD) of hip   Discharge Diagnoses:  Same  Past Medical History  Diagnosis Date  . Hypertension   . Kidney stones     Hx: of  . Arthritis   . Pneumonia     remote history    Surgeries: Procedure(s): LEFT TOTAL HIP ARTHROPLASTY ANTERIOR APPROACH on 01/28/2014   Consultants:    Discharged Condition: Improved  Hospital Course: Ashley Lamb is an 73 y.o. female who was admitted 01/28/2014 for operative treatment ofDegenerative joint disease (DJD) of hip. Patient has severe unremitting pain that affects sleep, daily activities, and work/hobbies. After pre-op clearance the patient was taken to the operating room on 01/28/2014 and underwent  Procedure(s): LEFT TOTAL HIP ARTHROPLASTY ANTERIOR APPROACH.    Patient was given perioperative antibiotics: Anti-infectives   Start     Dose/Rate Route Frequency Ordered Stop   01/28/14 1330  ceFAZolin (ANCEF) IVPB 2 g/50 mL premix     2 g 100 mL/hr over 30 Minutes Intravenous Every 6 hours 01/28/14 1147 01/28/14 1922   01/28/14 0600  ceFAZolin (ANCEF) IVPB 2 g/50 mL premix     2 g 100 mL/hr over 30 Minutes Intravenous On call to O.R. 01/27/14 1403 01/28/14 3888       Patient was given sequential compression devices, early ambulation, and chemoprophylaxis to prevent DVT.  Patient had some post operative urinary retention and was treated with urecholine.  Patient benefited maximally from hospital stay and there were no complications.    Recent vital signs: Patient Vitals for the past 24 hrs:  BP Temp Pulse Resp SpO2  01/30/14 0744 - - - 19 -  01/30/14 0518 125/98 mmHg 98.4 F (36.9 C) 97 18 95 %  01/29/14 2119 109/54 mmHg 98.1 F (36.7 C) 74 19 98 %  01/29/14 2000 - - - 18 96 %  01/29/14 1600 - - - 18 98 %  01/29/14 1400 98/56 mmHg 98 F  (36.7 C) 65 16 98 %  01/29/14 1200 - - - 18 98 %     Recent laboratory studies:  Recent Labs  01/29/14 0546 01/30/14 0355  WBC 13.0* 9.0  HGB 8.7* 8.2*  HCT 26.3* 24.6*  PLT 167 137*  NA 135*  --   K 3.8  --   CL 98  --   CO2 23  --   BUN 18  --   CREATININE 1.11*  --   GLUCOSE 116*  --   CALCIUM 8.3*  --      Discharge Medications:     Medication List    STOP taking these medications       ibuprofen 200 MG tablet  Commonly known as:  ADVIL,MOTRIN      TAKE these medications       amLODipine 5 MG tablet  Commonly known as:  NORVASC  Take 5 mg by mouth daily.     aspirin 325 MG EC tablet  Take 1 tablet (325 mg total) by mouth 2 (two) times daily after a meal.     bethanechol 10 MG tablet  Commonly known as:  URECHOLINE  Take 1 tablet (10 mg total) by mouth 3 (three) times daily.     estradiol 1 MG tablet  Commonly known as:  ESTRACE  Take 1 mg by mouth daily.     hydrochlorothiazide 25 MG  tablet  Commonly known as:  HYDRODIURIL  Take 12.5 mg by mouth daily.     HYDROcodone-acetaminophen 7.5-325 MG per tablet  Commonly known as:  NORCO  Take 1-2 tablets by mouth every 4 (four) hours as needed for moderate pain (breakthrough pain).     methocarbamol 500 MG tablet  Commonly known as:  ROBAXIN  Take 1 tablet (500 mg total) by mouth every 6 (six) hours as needed for muscle spasms.     metoprolol 50 MG tablet  Commonly known as:  LOPRESSOR  Take 50 mg by mouth 2 (two) times daily.     mupirocin nasal ointment 2 %  Commonly known as:  BACTROBAN  Place 1 application into the nose 2 (two) times daily. Use one-half of tube in each nostril twice daily for five (5) days. After application, press sides of nose together and gently massage.     potassium chloride 10 MEQ tablet  Commonly known as:  K-DUR,KLOR-CON  Take 10 mEq by mouth 2 (two) times daily.     Vitamin D 2000 UNITS Caps  Take 2,000 Units by mouth daily.        Diagnostic Studies: Dg Hip  Operative Left  02-10-2014   CLINICAL DATA:  Post LEFT hip arthroplasty  EXAM: OPERATIVE LEFT HIP  COMPARISON:  Two digital C-arm fluoroscopic images obtained intraoperatively are submitted. No prior exams for comparison.  FINDINGS: Components of LEFT hip prosthesis in expected positions.  No acute fracture or dislocation identified.  IMPRESSION: LEFT hip prosthesis without acute complication.   Electronically Signed   By: Lavonia Dana M.D.   On: February 10, 2014 09:39    Disposition: 01-Home or Self Care      Discharge Instructions   Call MD / Call 911    Complete by:  As directed   If you experience chest pain or shortness of breath, CALL 911 and be transported to the hospital emergency room.  If you develope a fever above 101 F, pus (white drainage) or increased drainage or redness at the wound, or calf pain, call your surgeon's office.     Constipation Prevention    Complete by:  As directed   Drink plenty of fluids.  Prune juice may be helpful.  You may use a stool softener, such as Colace (over the counter) 100 mg twice a day.  Use MiraLax (over the counter) for constipation as needed.     Diet - low sodium heart healthy    Complete by:  As directed      Increase activity slowly as tolerated    Complete by:  As directed            Follow-up Information   Follow up with Hessie Dibble, MD. Call in 2 weeks.   Specialty:  Orthopedic Surgery   Contact information:   [place] [address] [phone]        Signed: Rich Fuchs 01/30/2014, 11:16 AM

## 2014-01-30 NOTE — Progress Notes (Signed)
Physical Therapy Treatment Patient Details Name: Ashley Lamb MRN: [id number] DOB: 03-18-1941 Today's Date: 01/30/2014    History of Present Illness 73 y.o. female s/p left total hip arthroplasty via direct anterior approach. Hx of HTN and previous back sx.    PT Comments    Patient progressing with ambulation and mobility. Ready to DC today from PT standpoint.   Follow Up Recommendations  Home health PT;Supervision for mobility/OOB     Equipment Recommendations  Rolling walker with 5" wheels;3in1 (PT)    Recommendations for Other Services       Precautions / Restrictions Precautions Precautions: None Restrictions LLE Weight Bearing: Weight bearing as tolerated    Mobility  Bed Mobility               General bed mobility comments: Pt up in chair  Transfers Overall transfer level: Modified independent                  Ambulation/Gait Ambulation/Gait assistance: Supervision Ambulation Distance (Feet): 300 Feet Assistive device: Rolling walker (2 wheeled) Gait Pattern/deviations: Decreased stride length;Step-through pattern     General Gait Details: Cues to stand upright.    Stairs            Wheelchair Mobility    Modified Rankin (Stroke Patients Only)       Balance                                    Cognition Arousal/Alertness: Awake/alert Behavior During Therapy: WFL for tasks assessed/performed Overall Cognitive Status: Within Functional Limits for tasks assessed                      Exercises Total Joint Exercises Long Arc Quad: AROM;10 reps;Left    General Comments        Pertinent Vitals/Pain 5/10 Hip pain RN provided medication to assist with pain control     Home Living                      Prior Function            PT Goals (current goals can now be found in the care plan section) Progress towards PT goals: Progressing toward goals    Frequency  7X/week    PT Plan  Current plan remains appropriate    Co-evaluation             End of Session Equipment Utilized During Treatment: Gait belt Activity Tolerance: Patient tolerated treatment well Patient left: in chair;with call bell/phone within reach     Time: 0750-0814 PT Time Calculation (min): 24 min  Charges:  $Gait Training: 8-22 mins $Therapeutic Exercise: 8-22 mins                    G Codes:      Jacqualyn Posey 01/30/2014, 9:00 AM  01/30/2014 Jacqualyn Posey PTA [phone] pager [phone] office

## 2014-01-30 NOTE — Progress Notes (Signed)
Discharge instructions given. Pt verbalized understanding and all questions were answered.  

## 2014-02-01 DIAGNOSIS — Z96649 Presence of unspecified artificial hip joint: Secondary | ICD-10-CM | POA: Diagnosis not present

## 2014-02-01 DIAGNOSIS — IMO0001 Reserved for inherently not codable concepts without codable children: Secondary | ICD-10-CM | POA: Diagnosis not present

## 2014-02-01 DIAGNOSIS — Z471 Aftercare following joint replacement surgery: Secondary | ICD-10-CM | POA: Diagnosis not present

## 2014-02-03 DIAGNOSIS — IMO0001 Reserved for inherently not codable concepts without codable children: Secondary | ICD-10-CM | POA: Diagnosis not present

## 2014-02-03 DIAGNOSIS — Z96649 Presence of unspecified artificial hip joint: Secondary | ICD-10-CM | POA: Diagnosis not present

## 2014-02-03 DIAGNOSIS — Z471 Aftercare following joint replacement surgery: Secondary | ICD-10-CM | POA: Diagnosis not present

## 2014-02-05 DIAGNOSIS — Z471 Aftercare following joint replacement surgery: Secondary | ICD-10-CM | POA: Diagnosis not present

## 2014-02-05 DIAGNOSIS — IMO0001 Reserved for inherently not codable concepts without codable children: Secondary | ICD-10-CM | POA: Diagnosis not present

## 2014-02-05 DIAGNOSIS — Z96649 Presence of unspecified artificial hip joint: Secondary | ICD-10-CM | POA: Diagnosis not present

## 2014-02-06 DIAGNOSIS — IMO0001 Reserved for inherently not codable concepts without codable children: Secondary | ICD-10-CM | POA: Diagnosis not present

## 2014-02-06 DIAGNOSIS — Z471 Aftercare following joint replacement surgery: Secondary | ICD-10-CM | POA: Diagnosis not present

## 2014-02-06 DIAGNOSIS — Z96649 Presence of unspecified artificial hip joint: Secondary | ICD-10-CM | POA: Diagnosis not present

## 2014-02-07 DIAGNOSIS — M1610 Unilateral primary osteoarthritis, unspecified hip: Secondary | ICD-10-CM | POA: Diagnosis not present

## 2014-02-10 DIAGNOSIS — IMO0001 Reserved for inherently not codable concepts without codable children: Secondary | ICD-10-CM | POA: Diagnosis not present

## 2014-02-10 DIAGNOSIS — Z471 Aftercare following joint replacement surgery: Secondary | ICD-10-CM | POA: Diagnosis not present

## 2014-02-10 DIAGNOSIS — Z96649 Presence of unspecified artificial hip joint: Secondary | ICD-10-CM | POA: Diagnosis not present

## 2014-02-11 DIAGNOSIS — Z96649 Presence of unspecified artificial hip joint: Secondary | ICD-10-CM | POA: Diagnosis not present

## 2014-02-11 DIAGNOSIS — Z471 Aftercare following joint replacement surgery: Secondary | ICD-10-CM | POA: Diagnosis not present

## 2014-02-11 DIAGNOSIS — IMO0001 Reserved for inherently not codable concepts without codable children: Secondary | ICD-10-CM | POA: Diagnosis not present

## 2014-02-12 ENCOUNTER — Ambulatory Visit: Payer: Medicare Other

## 2014-02-12 DIAGNOSIS — Z96649 Presence of unspecified artificial hip joint: Secondary | ICD-10-CM | POA: Diagnosis not present

## 2014-02-12 DIAGNOSIS — IMO0001 Reserved for inherently not codable concepts without codable children: Secondary | ICD-10-CM | POA: Diagnosis not present

## 2014-02-12 DIAGNOSIS — Z471 Aftercare following joint replacement surgery: Secondary | ICD-10-CM | POA: Diagnosis not present

## 2014-02-14 DIAGNOSIS — Z471 Aftercare following joint replacement surgery: Secondary | ICD-10-CM | POA: Diagnosis not present

## 2014-02-14 DIAGNOSIS — IMO0001 Reserved for inherently not codable concepts without codable children: Secondary | ICD-10-CM | POA: Diagnosis not present

## 2014-02-14 DIAGNOSIS — Z96649 Presence of unspecified artificial hip joint: Secondary | ICD-10-CM | POA: Diagnosis not present

## 2014-02-18 DIAGNOSIS — IMO0001 Reserved for inherently not codable concepts without codable children: Secondary | ICD-10-CM | POA: Diagnosis not present

## 2014-02-18 DIAGNOSIS — Z96649 Presence of unspecified artificial hip joint: Secondary | ICD-10-CM | POA: Diagnosis not present

## 2014-02-18 DIAGNOSIS — Z471 Aftercare following joint replacement surgery: Secondary | ICD-10-CM | POA: Diagnosis not present

## 2014-02-20 DIAGNOSIS — Z96649 Presence of unspecified artificial hip joint: Secondary | ICD-10-CM | POA: Diagnosis not present

## 2014-02-20 DIAGNOSIS — Z471 Aftercare following joint replacement surgery: Secondary | ICD-10-CM | POA: Diagnosis not present

## 2014-02-20 DIAGNOSIS — IMO0001 Reserved for inherently not codable concepts without codable children: Secondary | ICD-10-CM | POA: Diagnosis not present

## 2014-02-25 DIAGNOSIS — Z471 Aftercare following joint replacement surgery: Secondary | ICD-10-CM | POA: Diagnosis not present

## 2014-02-25 DIAGNOSIS — IMO0001 Reserved for inherently not codable concepts without codable children: Secondary | ICD-10-CM | POA: Diagnosis not present

## 2014-02-25 DIAGNOSIS — Z96649 Presence of unspecified artificial hip joint: Secondary | ICD-10-CM | POA: Diagnosis not present

## 2014-02-27 DIAGNOSIS — Z96649 Presence of unspecified artificial hip joint: Secondary | ICD-10-CM | POA: Diagnosis not present

## 2014-02-27 DIAGNOSIS — Z471 Aftercare following joint replacement surgery: Secondary | ICD-10-CM | POA: Diagnosis not present

## 2014-02-27 DIAGNOSIS — IMO0001 Reserved for inherently not codable concepts without codable children: Secondary | ICD-10-CM | POA: Diagnosis not present

## 2014-02-28 DIAGNOSIS — M25559 Pain in unspecified hip: Secondary | ICD-10-CM | POA: Diagnosis not present

## 2014-02-28 DIAGNOSIS — M6281 Muscle weakness (generalized): Secondary | ICD-10-CM | POA: Diagnosis not present

## 2014-02-28 DIAGNOSIS — R262 Difficulty in walking, not elsewhere classified: Secondary | ICD-10-CM | POA: Diagnosis not present

## 2014-03-03 DIAGNOSIS — M25559 Pain in unspecified hip: Secondary | ICD-10-CM | POA: Diagnosis not present

## 2014-03-03 DIAGNOSIS — R262 Difficulty in walking, not elsewhere classified: Secondary | ICD-10-CM | POA: Diagnosis not present

## 2014-03-03 DIAGNOSIS — M6281 Muscle weakness (generalized): Secondary | ICD-10-CM | POA: Diagnosis not present

## 2014-03-06 DIAGNOSIS — M25559 Pain in unspecified hip: Secondary | ICD-10-CM | POA: Diagnosis not present

## 2014-03-06 DIAGNOSIS — R262 Difficulty in walking, not elsewhere classified: Secondary | ICD-10-CM | POA: Diagnosis not present

## 2014-03-06 DIAGNOSIS — M6281 Muscle weakness (generalized): Secondary | ICD-10-CM | POA: Diagnosis not present

## 2014-03-11 DIAGNOSIS — R262 Difficulty in walking, not elsewhere classified: Secondary | ICD-10-CM | POA: Diagnosis not present

## 2014-03-11 DIAGNOSIS — M25559 Pain in unspecified hip: Secondary | ICD-10-CM | POA: Diagnosis not present

## 2014-03-11 DIAGNOSIS — M6281 Muscle weakness (generalized): Secondary | ICD-10-CM | POA: Diagnosis not present

## 2014-03-13 DIAGNOSIS — M25559 Pain in unspecified hip: Secondary | ICD-10-CM | POA: Diagnosis not present

## 2014-03-13 DIAGNOSIS — R262 Difficulty in walking, not elsewhere classified: Secondary | ICD-10-CM | POA: Diagnosis not present

## 2014-03-13 DIAGNOSIS — M6281 Muscle weakness (generalized): Secondary | ICD-10-CM | POA: Diagnosis not present

## 2014-03-18 DIAGNOSIS — M6281 Muscle weakness (generalized): Secondary | ICD-10-CM | POA: Diagnosis not present

## 2014-03-18 DIAGNOSIS — R262 Difficulty in walking, not elsewhere classified: Secondary | ICD-10-CM | POA: Diagnosis not present

## 2014-03-18 DIAGNOSIS — M25559 Pain in unspecified hip: Secondary | ICD-10-CM | POA: Diagnosis not present

## 2014-03-20 DIAGNOSIS — M6281 Muscle weakness (generalized): Secondary | ICD-10-CM | POA: Diagnosis not present

## 2014-03-20 DIAGNOSIS — R262 Difficulty in walking, not elsewhere classified: Secondary | ICD-10-CM | POA: Diagnosis not present

## 2014-03-20 DIAGNOSIS — M25559 Pain in unspecified hip: Secondary | ICD-10-CM | POA: Diagnosis not present

## 2014-03-25 DIAGNOSIS — M6281 Muscle weakness (generalized): Secondary | ICD-10-CM | POA: Diagnosis not present

## 2014-03-25 DIAGNOSIS — M25559 Pain in unspecified hip: Secondary | ICD-10-CM | POA: Diagnosis not present

## 2014-03-25 DIAGNOSIS — R262 Difficulty in walking, not elsewhere classified: Secondary | ICD-10-CM | POA: Diagnosis not present

## 2014-03-27 DIAGNOSIS — R262 Difficulty in walking, not elsewhere classified: Secondary | ICD-10-CM | POA: Diagnosis not present

## 2014-03-27 DIAGNOSIS — M25559 Pain in unspecified hip: Secondary | ICD-10-CM | POA: Diagnosis not present

## 2014-03-27 DIAGNOSIS — M6281 Muscle weakness (generalized): Secondary | ICD-10-CM | POA: Diagnosis not present

## 2014-04-01 DIAGNOSIS — M6281 Muscle weakness (generalized): Secondary | ICD-10-CM | POA: Diagnosis not present

## 2014-04-01 DIAGNOSIS — R262 Difficulty in walking, not elsewhere classified: Secondary | ICD-10-CM | POA: Diagnosis not present

## 2014-04-01 DIAGNOSIS — M25559 Pain in unspecified hip: Secondary | ICD-10-CM | POA: Diagnosis not present

## 2014-04-03 DIAGNOSIS — R262 Difficulty in walking, not elsewhere classified: Secondary | ICD-10-CM | POA: Diagnosis not present

## 2014-04-03 DIAGNOSIS — M6281 Muscle weakness (generalized): Secondary | ICD-10-CM | POA: Diagnosis not present

## 2014-04-03 DIAGNOSIS — M25559 Pain in unspecified hip: Secondary | ICD-10-CM | POA: Diagnosis not present

## 2014-04-08 DIAGNOSIS — M6281 Muscle weakness (generalized): Secondary | ICD-10-CM | POA: Diagnosis not present

## 2014-04-08 DIAGNOSIS — M25559 Pain in unspecified hip: Secondary | ICD-10-CM | POA: Diagnosis not present

## 2014-04-08 DIAGNOSIS — R262 Difficulty in walking, not elsewhere classified: Secondary | ICD-10-CM | POA: Diagnosis not present

## 2014-04-10 DIAGNOSIS — M25559 Pain in unspecified hip: Secondary | ICD-10-CM | POA: Diagnosis not present

## 2014-04-10 DIAGNOSIS — R262 Difficulty in walking, not elsewhere classified: Secondary | ICD-10-CM | POA: Diagnosis not present

## 2014-04-10 DIAGNOSIS — M6281 Muscle weakness (generalized): Secondary | ICD-10-CM | POA: Diagnosis not present

## 2014-04-15 DIAGNOSIS — M25559 Pain in unspecified hip: Secondary | ICD-10-CM | POA: Diagnosis not present

## 2014-04-15 DIAGNOSIS — M6281 Muscle weakness (generalized): Secondary | ICD-10-CM | POA: Diagnosis not present

## 2014-04-15 DIAGNOSIS — R262 Difficulty in walking, not elsewhere classified: Secondary | ICD-10-CM | POA: Diagnosis not present

## 2014-04-17 DIAGNOSIS — R262 Difficulty in walking, not elsewhere classified: Secondary | ICD-10-CM | POA: Diagnosis not present

## 2014-04-17 DIAGNOSIS — M6281 Muscle weakness (generalized): Secondary | ICD-10-CM | POA: Diagnosis not present

## 2014-04-17 DIAGNOSIS — M25559 Pain in unspecified hip: Secondary | ICD-10-CM | POA: Diagnosis not present

## 2014-04-18 DIAGNOSIS — M545 Low back pain, unspecified: Secondary | ICD-10-CM | POA: Diagnosis not present

## 2014-04-22 DIAGNOSIS — R262 Difficulty in walking, not elsewhere classified: Secondary | ICD-10-CM | POA: Diagnosis not present

## 2014-04-22 DIAGNOSIS — M6281 Muscle weakness (generalized): Secondary | ICD-10-CM | POA: Diagnosis not present

## 2014-04-22 DIAGNOSIS — M25559 Pain in unspecified hip: Secondary | ICD-10-CM | POA: Diagnosis not present

## 2014-04-24 DIAGNOSIS — M25559 Pain in unspecified hip: Secondary | ICD-10-CM | POA: Diagnosis not present

## 2014-04-24 DIAGNOSIS — M6281 Muscle weakness (generalized): Secondary | ICD-10-CM | POA: Diagnosis not present

## 2014-04-24 DIAGNOSIS — R262 Difficulty in walking, not elsewhere classified: Secondary | ICD-10-CM | POA: Diagnosis not present

## 2014-04-30 DIAGNOSIS — M545 Low back pain, unspecified: Secondary | ICD-10-CM | POA: Diagnosis not present

## 2014-04-30 DIAGNOSIS — R262 Difficulty in walking, not elsewhere classified: Secondary | ICD-10-CM | POA: Diagnosis not present

## 2014-04-30 DIAGNOSIS — M6281 Muscle weakness (generalized): Secondary | ICD-10-CM | POA: Diagnosis not present

## 2014-05-05 DIAGNOSIS — R262 Difficulty in walking, not elsewhere classified: Secondary | ICD-10-CM | POA: Diagnosis not present

## 2014-05-05 DIAGNOSIS — M6281 Muscle weakness (generalized): Secondary | ICD-10-CM | POA: Diagnosis not present

## 2014-05-05 DIAGNOSIS — M5442 Lumbago with sciatica, left side: Secondary | ICD-10-CM | POA: Diagnosis not present

## 2014-05-05 DIAGNOSIS — M79605 Pain in left leg: Secondary | ICD-10-CM | POA: Diagnosis not present

## 2014-05-06 DIAGNOSIS — R262 Difficulty in walking, not elsewhere classified: Secondary | ICD-10-CM | POA: Diagnosis not present

## 2014-05-06 DIAGNOSIS — M6281 Muscle weakness (generalized): Secondary | ICD-10-CM | POA: Diagnosis not present

## 2014-05-06 DIAGNOSIS — M79605 Pain in left leg: Secondary | ICD-10-CM | POA: Diagnosis not present

## 2014-05-06 DIAGNOSIS — M5442 Lumbago with sciatica, left side: Secondary | ICD-10-CM | POA: Diagnosis not present

## 2014-05-08 DIAGNOSIS — M79605 Pain in left leg: Secondary | ICD-10-CM | POA: Diagnosis not present

## 2014-05-08 DIAGNOSIS — M6281 Muscle weakness (generalized): Secondary | ICD-10-CM | POA: Diagnosis not present

## 2014-05-08 DIAGNOSIS — R262 Difficulty in walking, not elsewhere classified: Secondary | ICD-10-CM | POA: Diagnosis not present

## 2014-05-08 DIAGNOSIS — M5442 Lumbago with sciatica, left side: Secondary | ICD-10-CM | POA: Diagnosis not present

## 2014-05-12 DIAGNOSIS — M5442 Lumbago with sciatica, left side: Secondary | ICD-10-CM | POA: Diagnosis not present

## 2014-05-12 DIAGNOSIS — M79605 Pain in left leg: Secondary | ICD-10-CM | POA: Diagnosis not present

## 2014-05-12 DIAGNOSIS — R262 Difficulty in walking, not elsewhere classified: Secondary | ICD-10-CM | POA: Diagnosis not present

## 2014-05-12 DIAGNOSIS — M6281 Muscle weakness (generalized): Secondary | ICD-10-CM | POA: Diagnosis not present

## 2014-05-13 DIAGNOSIS — M79605 Pain in left leg: Secondary | ICD-10-CM | POA: Diagnosis not present

## 2014-05-13 DIAGNOSIS — M6281 Muscle weakness (generalized): Secondary | ICD-10-CM | POA: Diagnosis not present

## 2014-05-13 DIAGNOSIS — M5442 Lumbago with sciatica, left side: Secondary | ICD-10-CM | POA: Diagnosis not present

## 2014-05-13 DIAGNOSIS — R262 Difficulty in walking, not elsewhere classified: Secondary | ICD-10-CM | POA: Diagnosis not present

## 2014-05-15 DIAGNOSIS — M5442 Lumbago with sciatica, left side: Secondary | ICD-10-CM | POA: Diagnosis not present

## 2014-05-15 DIAGNOSIS — R262 Difficulty in walking, not elsewhere classified: Secondary | ICD-10-CM | POA: Diagnosis not present

## 2014-05-15 DIAGNOSIS — M6281 Muscle weakness (generalized): Secondary | ICD-10-CM | POA: Diagnosis not present

## 2014-05-15 DIAGNOSIS — M79605 Pain in left leg: Secondary | ICD-10-CM | POA: Diagnosis not present

## 2014-05-19 DIAGNOSIS — M6281 Muscle weakness (generalized): Secondary | ICD-10-CM | POA: Diagnosis not present

## 2014-05-19 DIAGNOSIS — M79605 Pain in left leg: Secondary | ICD-10-CM | POA: Diagnosis not present

## 2014-05-19 DIAGNOSIS — R262 Difficulty in walking, not elsewhere classified: Secondary | ICD-10-CM | POA: Diagnosis not present

## 2014-05-19 DIAGNOSIS — M5442 Lumbago with sciatica, left side: Secondary | ICD-10-CM | POA: Diagnosis not present

## 2014-05-20 ENCOUNTER — Encounter (INDEPENDENT_AMBULATORY_CARE_PROVIDER_SITE_OTHER): Payer: Self-pay

## 2014-05-20 ENCOUNTER — Ambulatory Visit
Admission: RE | Admit: 2014-05-20 | Discharge: 2014-05-20 | Disposition: A | Discharge status: Home / Self Care | Payer: Medicare Other | Source: Ambulatory Visit

## 2014-05-20 DIAGNOSIS — Z1231 Encounter for screening mammogram for malignant neoplasm of breast: Secondary | ICD-10-CM

## 2014-05-21 DIAGNOSIS — R262 Difficulty in walking, not elsewhere classified: Secondary | ICD-10-CM | POA: Diagnosis not present

## 2014-05-21 DIAGNOSIS — M5442 Lumbago with sciatica, left side: Secondary | ICD-10-CM | POA: Diagnosis not present

## 2014-05-21 DIAGNOSIS — M79605 Pain in left leg: Secondary | ICD-10-CM | POA: Diagnosis not present

## 2014-05-21 DIAGNOSIS — M6281 Muscle weakness (generalized): Secondary | ICD-10-CM | POA: Diagnosis not present

## 2014-05-26 DIAGNOSIS — M6281 Muscle weakness (generalized): Secondary | ICD-10-CM | POA: Diagnosis not present

## 2014-05-26 DIAGNOSIS — R262 Difficulty in walking, not elsewhere classified: Secondary | ICD-10-CM | POA: Diagnosis not present

## 2014-05-26 DIAGNOSIS — M79605 Pain in left leg: Secondary | ICD-10-CM | POA: Diagnosis not present

## 2014-05-26 DIAGNOSIS — M5442 Lumbago with sciatica, left side: Secondary | ICD-10-CM | POA: Diagnosis not present

## 2014-05-28 DIAGNOSIS — M545 Low back pain: Secondary | ICD-10-CM | POA: Diagnosis not present

## 2014-05-30 DIAGNOSIS — Z23 Encounter for immunization: Secondary | ICD-10-CM | POA: Diagnosis not present

## 2014-05-30 DIAGNOSIS — E559 Vitamin D deficiency, unspecified: Secondary | ICD-10-CM | POA: Diagnosis not present

## 2014-05-30 DIAGNOSIS — I10 Essential (primary) hypertension: Secondary | ICD-10-CM | POA: Diagnosis not present

## 2014-05-30 DIAGNOSIS — E780 Pure hypercholesterolemia: Secondary | ICD-10-CM | POA: Diagnosis not present

## 2014-05-30 DIAGNOSIS — N951 Menopausal and female climacteric states: Secondary | ICD-10-CM | POA: Diagnosis not present

## 2014-05-30 DIAGNOSIS — M150 Primary generalized (osteo)arthritis: Secondary | ICD-10-CM | POA: Diagnosis not present

## 2014-06-25 DIAGNOSIS — M545 Low back pain: Secondary | ICD-10-CM | POA: Diagnosis not present

## 2014-10-20 DIAGNOSIS — Z1211 Encounter for screening for malignant neoplasm of colon: Secondary | ICD-10-CM | POA: Diagnosis not present

## 2014-10-20 DIAGNOSIS — K5730 Diverticulosis of large intestine without perforation or abscess without bleeding: Secondary | ICD-10-CM | POA: Diagnosis not present

## 2014-10-21 DIAGNOSIS — J189 Pneumonia, unspecified organism: Secondary | ICD-10-CM | POA: Diagnosis not present

## 2014-11-06 DIAGNOSIS — I10 Essential (primary) hypertension: Secondary | ICD-10-CM | POA: Diagnosis not present

## 2014-11-06 DIAGNOSIS — J189 Pneumonia, unspecified organism: Secondary | ICD-10-CM | POA: Diagnosis not present

## 2014-12-01 ENCOUNTER — Ambulatory Visit
Admission: RE | Admit: 2014-12-01 | Discharge: 2014-12-01 | Disposition: A | Discharge status: Home / Self Care | Payer: Medicare Other | Source: Ambulatory Visit | Attending: Family Medicine | Admitting: Family Medicine

## 2014-12-01 ENCOUNTER — Other Ambulatory Visit: Payer: Self-pay | Admitting: Family Medicine

## 2014-12-01 DIAGNOSIS — R059 Cough, unspecified: Secondary | ICD-10-CM

## 2014-12-01 DIAGNOSIS — N951 Menopausal and female climacteric states: Secondary | ICD-10-CM | POA: Diagnosis not present

## 2014-12-01 DIAGNOSIS — E559 Vitamin D deficiency, unspecified: Secondary | ICD-10-CM | POA: Diagnosis not present

## 2014-12-01 DIAGNOSIS — M150 Primary generalized (osteo)arthritis: Secondary | ICD-10-CM | POA: Diagnosis not present

## 2014-12-01 DIAGNOSIS — I10 Essential (primary) hypertension: Secondary | ICD-10-CM | POA: Diagnosis not present

## 2014-12-01 DIAGNOSIS — R05 Cough: Secondary | ICD-10-CM

## 2014-12-01 DIAGNOSIS — E780 Pure hypercholesterolemia: Secondary | ICD-10-CM | POA: Diagnosis not present

## 2015-01-28 DIAGNOSIS — M1611 Unilateral primary osteoarthritis, right hip: Secondary | ICD-10-CM | POA: Diagnosis not present

## 2015-03-03 ENCOUNTER — Other Ambulatory Visit: Payer: Self-pay | Admitting: Orthopaedic Surgery

## 2015-03-30 DIAGNOSIS — M1611 Unilateral primary osteoarthritis, right hip: Secondary | ICD-10-CM | POA: Diagnosis not present

## 2015-04-15 NOTE — Pre-Procedure Instructions (Signed)
Ashley Lamb  04/15/2015      [facility], [place] - [address] [place] [address] Phone: [phone] Fax: [phone]  [facility] [address] [place] [address] Phone: [phone]     Your procedure is scheduled on Tuesday, April 28, 2015  Report to [facility] Admitting at 5:30 A.M.  Call this number if you have problems the morning of surgery:  [phone]   Remember:  Do not eat food or drink liquids after midnight Monday, April 27, 2015  Take these medicines the morning of surgery with A SIP OF WATER :amLODipine (NORVASC), estradiol (ESTRACE),  metoprolol (LOPRESSOR), if needed:HYDROcodone-acetaminophen (NORCO/VICODIN) for pain  Stop taking Aspirin, vitamins and herbal medications. Do not take any NSAIDs ie: Ibuprofen, Advil, Naproxen or any medication containing Aspirin; stop Tuesday, April 21, 2015.   Do not wear jewelry, make-up or nail polish.  Do not wear lotions, powders, or perfumes.  You may not wear deodorant.  Do not shave 48 hours prior to surgery.    Do not bring valuables to the hospital.  [facility] is not responsible for any belongings or valuables.  Contacts, dentures or bridgework may not be worn into surgery.  Leave your suitcase in the car.  After surgery it may be brought to your room.  For patients admitted to the hospital, discharge time will be determined by your treatment team.  Patients discharged the day of surgery will not be allowed to drive home.   Name and phone number of your driver:   Special instructions: Special Instructions:Special Instructions: [facility] - Preparing for Surgery  Before surgery, you can play an important role.  Because skin is not sterile, your skin needs to be as free of germs as possible.  You can reduce the number of germs on you skin by washing with CHG (chlorahexidine gluconate) soap before surgery.  CHG is an  antiseptic cleaner which kills germs and bonds with the skin to continue killing germs even after washing.  Please DO NOT use if you have an allergy to CHG or antibacterial soaps.  If your skin becomes reddened/irritated stop using the CHG and inform your nurse when you arrive at Short Stay.  Do not shave (including legs and underarms) for at least 48 hours prior to the first CHG shower.  You may shave your face.  Please follow these instructions carefully:   1.  Shower with CHG Soap the night before surgery and the morning of Surgery.  2.  If you choose to wash your hair, wash your hair first as usual with your normal shampoo.  3.  After you shampoo, rinse your hair and body thoroughly to remove the Shampoo.  4.  Use CHG as you would any other liquid soap.  You can apply chg directly  to the skin and wash gently with scrungie or a clean washcloth.  5.  Apply the CHG Soap to your body ONLY FROM THE NECK DOWN.  Do not use on open wounds or open sores.  Avoid contact with your eyes, ears, mouth and genitals (private parts).  Wash genitals (private parts) with your normal soap.  6.  Wash thoroughly, paying special attention to the area where your surgery will be performed.  7.  Thoroughly rinse your body with warm water from the neck down.  8.  DO NOT shower/wash with your normal soap after using and rinsing off the CHG Soap.  9.  Pat yourself dry with a clean towel.            10.  Wear clean pajamas.            11.  Place clean sheets on your bed the night of your first shower and do not sleep with pets.  Day of Surgery  Do not apply any lotions/deodorants the morning of surgery.  Please wear clean clothes to the hospital/surgery center.  Please read over the following fact sheets that you were given. Pain Booklet, Coughing and Deep Breathing, Blood Transfusion Information, Total Joint Packet, MRSA Information and Surgical Site Infection Prevention

## 2015-04-16 ENCOUNTER — Other Ambulatory Visit (HOSPITAL_COMMUNITY): Payer: Medicare Other

## 2015-04-16 ENCOUNTER — Encounter (HOSPITAL_COMMUNITY): Payer: Self-pay

## 2015-04-16 ENCOUNTER — Encounter (HOSPITAL_COMMUNITY)
Admission: RE | Admit: 2015-04-16 | Discharge: 2015-04-16 | Disposition: A | Discharge status: Home / Self Care | Payer: Medicare Other | Source: Ambulatory Visit | Attending: Orthopaedic Surgery | Admitting: Orthopaedic Surgery

## 2015-04-16 DIAGNOSIS — Z0183 Encounter for blood typing: Secondary | ICD-10-CM | POA: Insufficient documentation

## 2015-04-16 DIAGNOSIS — Z7982 Long term (current) use of aspirin: Secondary | ICD-10-CM | POA: Diagnosis not present

## 2015-04-16 DIAGNOSIS — Z01818 Encounter for other preprocedural examination: Secondary | ICD-10-CM | POA: Diagnosis not present

## 2015-04-16 DIAGNOSIS — Z96642 Presence of left artificial hip joint: Secondary | ICD-10-CM | POA: Diagnosis not present

## 2015-04-16 DIAGNOSIS — Z79899 Other long term (current) drug therapy: Secondary | ICD-10-CM | POA: Diagnosis not present

## 2015-04-16 DIAGNOSIS — Z01812 Encounter for preprocedural laboratory examination: Secondary | ICD-10-CM | POA: Insufficient documentation

## 2015-04-16 DIAGNOSIS — R9431 Abnormal electrocardiogram [ECG] [EKG]: Secondary | ICD-10-CM | POA: Insufficient documentation

## 2015-04-16 DIAGNOSIS — Z87891 Personal history of nicotine dependence: Secondary | ICD-10-CM | POA: Diagnosis not present

## 2015-04-16 DIAGNOSIS — M1611 Unilateral primary osteoarthritis, right hip: Secondary | ICD-10-CM | POA: Diagnosis not present

## 2015-04-16 DIAGNOSIS — I10 Essential (primary) hypertension: Secondary | ICD-10-CM | POA: Insufficient documentation

## 2015-04-16 HISTORY — DX: Personal history of urinary calculi: Z87.442

## 2015-04-16 LAB — BASIC METABOLIC PANEL
Anion gap: 7 (ref 5–15)
BUN: 14 mg/dL (ref 6–20)
CALCIUM: 9.1 mg/dL (ref 8.9–10.3)
CO2: 24 mmol/L (ref 22–32)
CREATININE: 1.1 mg/dL — AB (ref 0.44–1.00)
Chloride: 106 mmol/L (ref 101–111)
GFR calc Af Amer: 57 mL/min — ABNORMAL LOW (ref >60)
GFR, EST NON AFRICAN AMERICAN: 49 mL/min — AB (ref >60)
GLUCOSE: 96 mg/dL (ref 65–99)
Potassium: 3.7 mmol/L (ref 3.5–5.1)
SODIUM: 137 mmol/L (ref 135–145)

## 2015-04-16 LAB — SURGICAL PCR SCREEN
MRSA, PCR: NEGATIVE
Staphylococcus aureus: NEGATIVE

## 2015-04-16 LAB — URINALYSIS, ROUTINE W REFLEX MICROSCOPIC
Bilirubin Urine: NEGATIVE
Glucose, UA: NEGATIVE mg/dL
HGB URINE DIPSTICK: NEGATIVE
Ketones, ur: NEGATIVE mg/dL
Leukocytes, UA: NEGATIVE
Nitrite: NEGATIVE
PH: 5 (ref 5.0–8.0)
Protein, ur: NEGATIVE mg/dL
SPECIFIC GRAVITY, URINE: 1.018 (ref 1.005–1.030)
UROBILINOGEN UA: 1 mg/dL (ref 0.0–1.0)

## 2015-04-16 LAB — TYPE AND SCREEN
ABO/RH(D): A POS
Antibody Screen: NEGATIVE

## 2015-04-16 LAB — CBC WITH DIFFERENTIAL/PLATELET
BASOS ABS: 0 10*3/uL (ref 0.0–0.1)
BASOS PCT: 1 %
EOS ABS: 0.3 10*3/uL (ref 0.0–0.7)
EOS PCT: 6 %
HCT: 35 % — ABNORMAL LOW (ref 36.0–46.0)
Hemoglobin: 11.5 g/dL — ABNORMAL LOW (ref 12.0–15.0)
Lymphocytes Relative: 46 %
Lymphs Abs: 2.6 10*3/uL (ref 0.7–4.0)
MCH: 28.1 pg (ref 26.0–34.0)
MCHC: 32.9 g/dL (ref 30.0–36.0)
MCV: 85.6 fL (ref 78.0–100.0)
MONO ABS: 0.5 10*3/uL (ref 0.1–1.0)
Monocytes Relative: 8 %
Neutro Abs: 2.2 10*3/uL (ref 1.7–7.7)
Neutrophils Relative %: 39 %
PLATELETS: 154 10*3/uL (ref 150–400)
RBC: 4.09 MIL/uL (ref 3.87–5.11)
RDW: 17 % — AB (ref 11.5–15.5)
WBC: 5.6 10*3/uL (ref 4.0–10.5)

## 2015-04-16 LAB — PROTIME-INR
INR: 1.02 (ref 0.00–1.49)
PROTHROMBIN TIME: 13.6 s (ref 11.6–15.2)

## 2015-04-16 LAB — APTT: APTT: 31 s (ref 24–37)

## 2015-04-16 NOTE — Progress Notes (Signed)
PCP- Dr. Donnie Coffin at [place] Cardiologist - Pt. Denies  EKG- 04/16/15 - Epic CXR- 12/01/14 - Epic  Echo/Stress Test/Cardiac Cath - pt. Denies  Patient denies chest pain and shortness of breath at PAT appointment.

## 2015-04-16 NOTE — Progress Notes (Signed)
Anesthesia Chart Review:  Pt is 74 year old female scheduled for R total hip arthroplasty on 04/28/2015 with Dr. Rhona Raider.   PMH includes: HTN. Former smoker. BMI 31. S/p L THA 01/28/14. S/p R thyroidectomy 05/17/13.   Medications include: amlodipine, ASA, hctz, metoprolol, potassium.   Preoperative labs reviewed.   Chest x-ray 12/01/2014 reviewed. COPD. There is minimal atelectasis versus scarring just above the left hemidiaphragm. There is no CHF or alveolar pneumonia.  EKG 04/16/2015: NSR. Nonspecific ST and T wave abnormality.   Carotid duplex US 03/25/2013:  1. Mild atherosclerotic plaque bilaterally resulting in less than 50% diameter internal carotid stenoses. 2. Vertebral arteries are patent with normal antegrade flow. 3. Incidentally and incompletely imaged 4.1 cm complex cystic and solid right thyroid nodule. Recommend further evaluation with dedicated thyroid ultrasound followed by ultrasound guided fine needle aspiration biopsy is warranted.  If no changes, I anticipate pt can proceed with surgery as scheduled.   Willeen Cass, FNP-BC [facility] Short Stay Surgical Center/Anesthesiology Phone: [phone] 04/16/2015 3:14 PM

## 2015-04-27 MED ORDER — LACTATED RINGERS IV SOLN
INTRAVENOUS | Status: DC
  Administered 2015-04-28 (×2): via INTRAVENOUS

## 2015-04-27 MED ORDER — CEFAZOLIN SODIUM-DEXTROSE 2-3 GM-% IV SOLR
2.0000 g | INTRAVENOUS | Status: AC
  Administered 2015-04-28: 2 g via INTRAVENOUS
  Filled 2015-04-27: qty 50

## 2015-04-27 MED ORDER — CHLORHEXIDINE GLUCONATE 4 % EX LIQD: 60.0000 mL | Freq: Once | CUTANEOUS | Status: DC

## 2015-04-27 NOTE — H&P (Signed)
TOTAL HIP ADMISSION H&P  Patient is admitted for right total hip arthroplasty.  Subjective:  Chief Complaint: right hip pain  HPI: Ashley Lamb, 74 y.o. female, has a history of pain and functional disability in the right hip(s) due to arthritis and patient has failed non-surgical conservative treatments for greater than 12 weeks to include NSAID's and/or analgesics, supervised PT with diminished ADL's post treatment, use of assistive devices, weight reduction as appropriate and activity modification.  Onset of symptoms was gradual starting 5 years ago with gradually worsening course since that time.The patient noted no past surgery on the right hip(s).  Patient currently rates pain in the right hip at 10 out of 10 with activity. Patient has night pain, worsening of pain with activity and weight bearing, trendelenberg gait, pain that interfers with activities of daily living and pain with passive range of motion. Patient has evidence of subchondral cysts, subchondral sclerosis and joint space narrowing by imaging studies. This condition presents safety issues increasing the risk of falls. This patient has had none.  There is no current active infection.  Patient Active Problem List   Diagnosis Date Noted  . Degenerative joint disease (DJD) of hip 01/28/2014   Past Medical History  Diagnosis Date  . Hypertension   . Arthritis   . Pneumonia     remote history  . History of kidney stones     Past Surgical History  Procedure Laterality Date  . Abdominal hysterectomy    . Back surgery    . Cholecystectomy    . Colonoscopy      Hx: of  . Tonsillectomy    . Thyroidectomy Right 05/17/2013    Procedure: RIGHT THYROIDECTOMY;  Surgeon: Izora Gala, MD;  Location: [facility];  Service: ENT;  Laterality: Right;  . Total hip arthroplasty Left 01/28/2014    Procedure: LEFT TOTAL HIP ARTHROPLASTY ANTERIOR APPROACH;  Surgeon: Hessie Dibble, MD;  Location: [facility];  Service: Orthopedics;  Laterality:  Left;    No prescriptions prior to admission   No Known Allergies  Social History  Substance Use Topics  . Smoking status: Former Smoker -- 0.50 packs/day for 10 years    Types: Cigarettes  . Smokeless tobacco: Never Used     Comment: "quit many years ago can't remember when"  . Alcohol Use: No    Family History  Problem Relation Age of Onset  . Hypertension Other      Review of Systems  Constitutional: Negative.   HENT: Negative.   Eyes: Negative.   Respiratory: Negative.   Gastrointestinal: Negative.   Genitourinary: Negative.   Musculoskeletal: Positive for joint pain.  Skin: Negative.   Neurological: Negative.   Psychiatric/Behavioral: Negative.     Objective:  Physical Exam  Constitutional: She appears well-developed.  HENT:  Head: Normocephalic.  Right Ear: External ear normal.  Eyes: Pupils are equal, round, and reactive to light.  Neck: Normal range of motion.  Cardiovascular: Normal rate.   Respiratory: Effort normal.  GI: Soft.  Musculoskeletal:  Right hip exam severe pain with rotation.  Calf soft negative Homans nonpitting edema normal neurovascular status  Neurological: She is alert.  Skin: Skin is warm.  Psychiatric: She has a normal mood and affect.    Vital signs in last 24 hours:    Labs:   Estimated body mass index is 32.13 kg/(m^2) as calculated from the following:   Height as of 01/28/14: 5\' 9"  (1.753 m).   Weight as of 01/15/14: 98.748 kg (  217 lb 11.2 oz).   Imaging Review Plain radiographs demonstrate severe degenerative joint disease of the right hip(s). The bone quality appears to be good for age and reported activity level.  Assessment/Plan:  End stage arthritis, right hip(s)  The patient history, physical examination, clinical judgement of the provider and imaging studies are consistent with end stage degenerative joint disease of the right hip(s) and total hip arthroplasty is deemed medically necessary. The treatment options  including medical management, injection therapy, arthroscopy and arthroplasty were discussed at length. The risks and benefits of total hip arthroplasty were presented and reviewed. The risks due to aseptic loosening, infection, stiffness, dislocation/subluxation,  thromboembolic complications and other imponderables were discussed.  The patient acknowledged the explanation, agreed to proceed with the plan and consent was signed. Patient is being admitted for inpatient treatment for surgery, pain control, PT, OT, prophylactic antibiotics, VTE prophylaxis, progressive ambulation and ADL's and discharge planning.The patient is planning to be discharged home with home health services

## 2015-04-28 ENCOUNTER — Inpatient Hospital Stay (HOSPITAL_COMMUNITY)
Admission: RE | Admit: 2015-04-28 | Discharge: 2015-04-30 | DRG: 470 | Disposition: A | Discharge status: Home Health | Payer: Medicare Other | Source: Ambulatory Visit | Attending: Orthopaedic Surgery | Admitting: Orthopaedic Surgery

## 2015-04-28 ENCOUNTER — Inpatient Hospital Stay (HOSPITAL_COMMUNITY): Payer: Medicare Other | Admitting: Certified Registered"

## 2015-04-28 ENCOUNTER — Inpatient Hospital Stay (HOSPITAL_COMMUNITY): Payer: Medicare Other | Admitting: Emergency Medicine

## 2015-04-28 ENCOUNTER — Encounter (HOSPITAL_COMMUNITY)
Admission: RE | Disposition: A | Discharge status: Home Health | Payer: Self-pay | Source: Ambulatory Visit | Attending: Orthopaedic Surgery

## 2015-04-28 ENCOUNTER — Encounter (HOSPITAL_COMMUNITY): Payer: Self-pay | Admitting: Certified Registered"

## 2015-04-28 ENCOUNTER — Inpatient Hospital Stay (HOSPITAL_COMMUNITY): Payer: Medicare Other

## 2015-04-28 DIAGNOSIS — Z87891 Personal history of nicotine dependence: Secondary | ICD-10-CM | POA: Diagnosis not present

## 2015-04-28 DIAGNOSIS — Z96641 Presence of right artificial hip joint: Secondary | ICD-10-CM | POA: Diagnosis not present

## 2015-04-28 DIAGNOSIS — M25551 Pain in right hip: Secondary | ICD-10-CM | POA: Diagnosis not present

## 2015-04-28 DIAGNOSIS — M169 Osteoarthritis of hip, unspecified: Secondary | ICD-10-CM | POA: Diagnosis not present

## 2015-04-28 DIAGNOSIS — I10 Essential (primary) hypertension: Secondary | ICD-10-CM | POA: Diagnosis present

## 2015-04-28 DIAGNOSIS — Z87442 Personal history of urinary calculi: Secondary | ICD-10-CM | POA: Diagnosis not present

## 2015-04-28 DIAGNOSIS — M1611 Unilateral primary osteoarthritis, right hip: Secondary | ICD-10-CM | POA: Diagnosis not present

## 2015-04-28 DIAGNOSIS — Z471 Aftercare following joint replacement surgery: Secondary | ICD-10-CM | POA: Diagnosis not present

## 2015-04-28 DIAGNOSIS — Z419 Encounter for procedure for purposes other than remedying health state, unspecified: Secondary | ICD-10-CM

## 2015-04-28 HISTORY — PX: TOTAL HIP ARTHROPLASTY: SHX124

## 2015-04-28 SURGERY — ARTHROPLASTY, HIP, TOTAL, ANTERIOR APPROACH
Anesthesia: General | Site: Hip | Laterality: Right

## 2015-04-28 MED ORDER — PHENYLEPHRINE 40 MCG/ML (10ML) SYRINGE FOR IV PUSH (FOR BLOOD PRESSURE SUPPORT)
PREFILLED_SYRINGE | INTRAVENOUS | Status: AC
  Filled 2015-04-28: qty 10

## 2015-04-28 MED ORDER — MENTHOL 3 MG MT LOZG: 1.0000 | LOZENGE | OROMUCOSAL | Status: DC | PRN

## 2015-04-28 MED ORDER — DOCUSATE SODIUM 100 MG PO CAPS
100.0000 mg | ORAL_CAPSULE | Freq: Two times a day (BID) | ORAL | Status: DC
  Administered 2015-04-28 – 2015-04-30 (×4): 100 mg via ORAL
  Filled 2015-04-28 (×4): qty 1

## 2015-04-28 MED ORDER — HYDROCHLOROTHIAZIDE 25 MG PO TABS
12.5000 mg | ORAL_TABLET | Freq: Every day | ORAL | Status: DC
  Administered 2015-04-29 – 2015-04-30 (×2): 12.5 mg via ORAL
  Filled 2015-04-28 (×2): qty 1

## 2015-04-28 MED ORDER — ESTRADIOL 1 MG PO TABS
1.0000 mg | ORAL_TABLET | Freq: Every day | ORAL | Status: DC
  Administered 2015-04-29 – 2015-04-30 (×2): 1 mg via ORAL
  Filled 2015-04-28 (×2): qty 1

## 2015-04-28 MED ORDER — ROCURONIUM BROMIDE 100 MG/10ML IV SOLN
INTRAVENOUS | Status: DC | PRN
  Administered 2015-04-28: 50 mg via INTRAVENOUS
  Administered 2015-04-28: 10 mg via INTRAVENOUS

## 2015-04-28 MED ORDER — METOPROLOL TARTRATE 50 MG PO TABS
50.0000 mg | ORAL_TABLET | Freq: Two times a day (BID) | ORAL | Status: DC
  Administered 2015-04-28 – 2015-04-30 (×3): 50 mg via ORAL
  Filled 2015-04-28 (×4): qty 1

## 2015-04-28 MED ORDER — PROPOFOL 1000 MG/100ML IV EMUL
INTRAVENOUS | Status: AC
  Filled 2015-04-28: qty 100

## 2015-04-28 MED ORDER — NEOSTIGMINE METHYLSULFATE 10 MG/10ML IV SOLN
INTRAVENOUS | Status: DC | PRN
  Administered 2015-04-28: 3 mg via INTRAVENOUS

## 2015-04-28 MED ORDER — PROMETHAZINE HCL 25 MG/ML IJ SOLN: 6.2500 mg | INTRAMUSCULAR | Status: DC | PRN

## 2015-04-28 MED ORDER — HYDROMORPHONE HCL 1 MG/ML IJ SOLN
0.5000 mg | INTRAMUSCULAR | Status: DC | PRN
  Administered 2015-04-30 (×2): 1 mg via INTRAVENOUS
  Filled 2015-04-28 (×2): qty 1

## 2015-04-28 MED ORDER — HYDROCODONE-ACETAMINOPHEN 5-325 MG PO TABS
1.0000 | ORAL_TABLET | ORAL | Status: DC | PRN
  Administered 2015-04-28 – 2015-04-30 (×9): 2 via ORAL
  Filled 2015-04-28 (×9): qty 2

## 2015-04-28 MED ORDER — HYDROMORPHONE HCL 1 MG/ML IJ SOLN
INTRAMUSCULAR | Status: AC
  Filled 2015-04-28: qty 1

## 2015-04-28 MED ORDER — DEXAMETHASONE SODIUM PHOSPHATE 4 MG/ML IJ SOLN
INTRAMUSCULAR | Status: DC | PRN
  Administered 2015-04-28: 4 mg via INTRAVENOUS

## 2015-04-28 MED ORDER — METOCLOPRAMIDE HCL 5 MG PO TABS: 5.0000 mg | ORAL_TABLET | Freq: Three times a day (TID) | ORAL | Status: DC | PRN

## 2015-04-28 MED ORDER — MIDAZOLAM HCL 5 MG/5ML IJ SOLN
INTRAMUSCULAR | Status: DC | PRN
  Administered 2015-04-28: 2 mg via INTRAVENOUS

## 2015-04-28 MED ORDER — ACETAMINOPHEN 325 MG PO TABS
650.0000 mg | ORAL_TABLET | Freq: Four times a day (QID) | ORAL | Status: DC | PRN
  Administered 2015-04-28: 650 mg via ORAL

## 2015-04-28 MED ORDER — FENTANYL CITRATE (PF) 250 MCG/5ML IJ SOLN
INTRAMUSCULAR | Status: AC
  Filled 2015-04-28: qty 5

## 2015-04-28 MED ORDER — DEXTROSE 5 % IV SOLN
500.0000 mg | Freq: Four times a day (QID) | INTRAVENOUS | Status: DC | PRN
  Administered 2015-04-28: 500 mg via INTRAVENOUS
  Filled 2015-04-28 (×2): qty 5

## 2015-04-28 MED ORDER — POTASSIUM CHLORIDE CRYS ER 20 MEQ PO TBCR
10.0000 meq | EXTENDED_RELEASE_TABLET | Freq: Two times a day (BID) | ORAL | Status: DC
Start: 2015-04-28 — End: 2015-04-30
  Administered 2015-04-28 – 2015-04-30 (×4): 10 meq via ORAL
  Filled 2015-04-28 (×4): qty 1

## 2015-04-28 MED ORDER — LIDOCAINE HCL (CARDIAC) 20 MG/ML IV SOLN
INTRAVENOUS | Status: DC | PRN
  Administered 2015-04-28: 80 mg via INTRAVENOUS

## 2015-04-28 MED ORDER — BISACODYL 5 MG PO TBEC: 5.0000 mg | DELAYED_RELEASE_TABLET | Freq: Every day | ORAL | Status: DC | PRN

## 2015-04-28 MED ORDER — ONDANSETRON HCL 4 MG PO TABS: 4.0000 mg | ORAL_TABLET | Freq: Four times a day (QID) | ORAL | Status: DC | PRN

## 2015-04-28 MED ORDER — METOCLOPRAMIDE HCL 5 MG/ML IJ SOLN: 5.0000 mg | Freq: Three times a day (TID) | INTRAMUSCULAR | Status: DC | PRN

## 2015-04-28 MED ORDER — ROCURONIUM BROMIDE 50 MG/5ML IV SOLN
INTRAVENOUS | Status: AC
  Filled 2015-04-28: qty 1

## 2015-04-28 MED ORDER — MIDAZOLAM HCL 2 MG/2ML IJ SOLN
INTRAMUSCULAR | Status: AC
  Filled 2015-04-28: qty 4

## 2015-04-28 MED ORDER — ACETAMINOPHEN 325 MG PO TABS
ORAL_TABLET | ORAL | Status: AC
  Filled 2015-04-28: qty 2

## 2015-04-28 MED ORDER — PHENOL 1.4 % MT LIQD: 1.0000 | OROMUCOSAL | Status: DC | PRN

## 2015-04-28 MED ORDER — ALUM & MAG HYDROXIDE-SIMETH 200-200-20 MG/5ML PO SUSP: 30.0000 mL | ORAL | Status: DC | PRN

## 2015-04-28 MED ORDER — DIPHENHYDRAMINE HCL 12.5 MG/5ML PO ELIX: 12.5000 mg | ORAL_SOLUTION | ORAL | Status: DC | PRN

## 2015-04-28 MED ORDER — ASPIRIN EC 325 MG PO TBEC
325.0000 mg | DELAYED_RELEASE_TABLET | Freq: Two times a day (BID) | ORAL | Status: DC
  Administered 2015-04-29 – 2015-04-30 (×3): 325 mg via ORAL
  Filled 2015-04-28 (×3): qty 1

## 2015-04-28 MED ORDER — ONDANSETRON HCL 4 MG/2ML IJ SOLN
INTRAMUSCULAR | Status: DC | PRN
  Administered 2015-04-28: 4 mg via INTRAVENOUS

## 2015-04-28 MED ORDER — HYDROMORPHONE HCL 1 MG/ML IJ SOLN
0.2500 mg | INTRAMUSCULAR | Status: DC | PRN
  Administered 2015-04-28 (×4): 0.5 mg via INTRAVENOUS

## 2015-04-28 MED ORDER — LIDOCAINE HCL (CARDIAC) 20 MG/ML IV SOLN
INTRAVENOUS | Status: AC
  Filled 2015-04-28: qty 5

## 2015-04-28 MED ORDER — PROPOFOL 10 MG/ML IV BOLUS
INTRAVENOUS | Status: DC | PRN
  Administered 2015-04-28: 150 mg via INTRAVENOUS
  Administered 2015-04-28: 20 mg via INTRAVENOUS

## 2015-04-28 MED ORDER — LACTATED RINGERS IV SOLN
INTRAVENOUS | Status: DC
  Administered 2015-04-29: 06:00:00 via INTRAVENOUS

## 2015-04-28 MED ORDER — GLYCOPYRROLATE 0.2 MG/ML IJ SOLN
INTRAMUSCULAR | Status: DC | PRN
  Administered 2015-04-28: 0.4 mg via INTRAVENOUS

## 2015-04-28 MED ORDER — CEFAZOLIN SODIUM-DEXTROSE 2-3 GM-% IV SOLR
2.0000 g | Freq: Four times a day (QID) | INTRAVENOUS | Status: AC
Start: 2015-04-28 — End: 2015-04-29
  Administered 2015-04-28 – 2015-04-29 (×2): 2 g via INTRAVENOUS
  Filled 2015-04-28 (×2): qty 50

## 2015-04-28 MED ORDER — TRANEXAMIC ACID 1000 MG/10ML IV SOLN
1000.0000 mg | INTRAVENOUS | Status: AC
  Administered 2015-04-28: 1000 mg via INTRAVENOUS
  Filled 2015-04-28: qty 10

## 2015-04-28 MED ORDER — ACETAMINOPHEN 650 MG RE SUPP: 650.0000 mg | Freq: Four times a day (QID) | RECTAL | Status: DC | PRN

## 2015-04-28 MED ORDER — AMLODIPINE BESYLATE 5 MG PO TABS
5.0000 mg | ORAL_TABLET | Freq: Every day | ORAL | Status: DC
  Administered 2015-04-30: 5 mg via ORAL
  Filled 2015-04-28 (×2): qty 1

## 2015-04-28 MED ORDER — 0.9 % SODIUM CHLORIDE (POUR BTL) OPTIME
TOPICAL | Status: DC | PRN
  Administered 2015-04-28: 1000 mL

## 2015-04-28 MED ORDER — PHENYLEPHRINE HCL 10 MG/ML IJ SOLN
INTRAMUSCULAR | Status: DC | PRN
  Administered 2015-04-28 (×2): 80 ug via INTRAVENOUS

## 2015-04-28 MED ORDER — ONDANSETRON HCL 4 MG/2ML IJ SOLN: 4.0000 mg | Freq: Four times a day (QID) | INTRAMUSCULAR | Status: DC | PRN

## 2015-04-28 MED ORDER — FENTANYL CITRATE (PF) 100 MCG/2ML IJ SOLN
INTRAMUSCULAR | Status: DC | PRN
  Administered 2015-04-28 (×2): 100 ug via INTRAVENOUS
  Administered 2015-04-28: 50 ug via INTRAVENOUS

## 2015-04-28 MED ORDER — SUCCINYLCHOLINE CHLORIDE 20 MG/ML IJ SOLN
INTRAMUSCULAR | Status: AC
  Filled 2015-04-28: qty 1

## 2015-04-28 MED ORDER — PROPOFOL 10 MG/ML IV BOLUS
INTRAVENOUS | Status: AC
  Filled 2015-04-28: qty 20

## 2015-04-28 MED ORDER — METHOCARBAMOL 500 MG PO TABS
500.0000 mg | ORAL_TABLET | Freq: Four times a day (QID) | ORAL | Status: DC | PRN
  Administered 2015-04-28 – 2015-04-30 (×6): 500 mg via ORAL
  Filled 2015-04-28 (×7): qty 1

## 2015-04-28 SURGICAL SUPPLY — 54 items
APL SKNCLS STERI-STRIP NONHPOA (GAUZE/BANDAGES/DRESSINGS) ×1
BENZOIN TINCTURE PRP APPL 2/3 (GAUZE/BANDAGES/DRESSINGS) ×2 IMPLANT
BLADE SAW SGTL 18X1.27X75 (BLADE) ×2 IMPLANT
BLADE SAW SGTL 18X1.27X75MM (BLADE) ×1
BLADE SURG ROTATE 9660 (MISCELLANEOUS) IMPLANT
CAPT HIP TOTAL 2 ×2 IMPLANT
CELLS DAT CNTRL 66122 CELL SVR (MISCELLANEOUS) ×1 IMPLANT
CLOSURE STERI-STRIP 1/2X4 (GAUZE/BANDAGES/DRESSINGS) ×1
CLSR STERI-STRIP ANTIMIC 1/2X4 (GAUZE/BANDAGES/DRESSINGS) ×1 IMPLANT
COVER PERINEAL POST (MISCELLANEOUS) ×3 IMPLANT
COVER SURGICAL LIGHT HANDLE (MISCELLANEOUS) ×3 IMPLANT
DRAPE C-ARM 42X72 X-RAY (DRAPES) ×3 IMPLANT
DRAPE IMP U-DRAPE 54X76 (DRAPES) ×3 IMPLANT
DRAPE STERI IOBAN 125X83 (DRAPES) ×3 IMPLANT
DRAPE U-SHAPE 47X51 STRL (DRAPES) ×9 IMPLANT
DRSG AQUACEL AG ADV 3.5X10 (GAUZE/BANDAGES/DRESSINGS) ×3 IMPLANT
DURAPREP 26ML APPLICATOR (WOUND CARE) ×3 IMPLANT
ELECT BLADE 4.0 EZ CLEAN MEGAD (MISCELLANEOUS)
ELECT CAUTERY BLADE 6.4 (BLADE) ×3 IMPLANT
ELECT REM PT RETURN 9FT ADLT (ELECTROSURGICAL) ×3
ELECTRODE BLDE 4.0 EZ CLN MEGD (MISCELLANEOUS) IMPLANT
ELECTRODE REM PT RTRN 9FT ADLT (ELECTROSURGICAL) ×1 IMPLANT
FACESHIELD WRAPAROUND (MASK) ×6 IMPLANT
FACESHIELD WRAPAROUND OR TEAM (MASK) ×2 IMPLANT
GLOVE BIO SURGEON STRL SZ8 (GLOVE) ×15 IMPLANT
GLOVE BIOGEL PI IND STRL 8 (GLOVE) ×2 IMPLANT
GLOVE BIOGEL PI INDICATOR 8 (GLOVE) ×4
GOWN STRL REUS W/ TWL LRG LVL3 (GOWN DISPOSABLE) ×1 IMPLANT
GOWN STRL REUS W/ TWL XL LVL3 (GOWN DISPOSABLE) ×2 IMPLANT
GOWN STRL REUS W/TWL LRG LVL3 (GOWN DISPOSABLE) ×3
GOWN STRL REUS W/TWL XL LVL3 (GOWN DISPOSABLE) ×6
KIT BASIN OR (CUSTOM PROCEDURE TRAY) ×3 IMPLANT
KIT ROOM TURNOVER OR (KITS) ×3 IMPLANT
LINER BOOT UNIVERSAL DISP (MISCELLANEOUS) ×3 IMPLANT
MANIFOLD NEPTUNE II (INSTRUMENTS) ×3 IMPLANT
NS IRRIG 1000ML POUR BTL (IV SOLUTION) ×3 IMPLANT
PACK TOTAL JOINT (CUSTOM PROCEDURE TRAY) ×3 IMPLANT
PACK UNIVERSAL I (CUSTOM PROCEDURE TRAY) ×3 IMPLANT
PAD ARMBOARD 7.5X6 YLW CONV (MISCELLANEOUS) ×6 IMPLANT
RETRACTOR WND ALEXIS 18 MED (MISCELLANEOUS) ×1 IMPLANT
RTRCTR WOUND ALEXIS 18CM MED (MISCELLANEOUS) ×3
STAPLER VISISTAT 35W (STAPLE) ×3 IMPLANT
SUT ETHIBOND NAB CT1 #1 30IN (SUTURE) ×9 IMPLANT
SUT VIC AB 0 CT1 27 (SUTURE)
SUT VIC AB 0 CT1 27XBRD ANBCTR (SUTURE) IMPLANT
SUT VIC AB 1 CT1 27 (SUTURE) ×3
SUT VIC AB 1 CT1 27XBRD ANBCTR (SUTURE) ×1 IMPLANT
SUT VIC AB 2-0 CT1 27 (SUTURE) ×3
SUT VIC AB 2-0 CT1 TAPERPNT 27 (SUTURE) ×1 IMPLANT
SUT VLOC 180 0 24IN GS25 (SUTURE) ×3 IMPLANT
TOWEL OR 17X24 6PK STRL BLUE (TOWEL DISPOSABLE) ×3 IMPLANT
TOWEL OR 17X26 10 PK STRL BLUE (TOWEL DISPOSABLE) ×6 IMPLANT
TRAY FOLEY CATH 14FR (SET/KITS/TRAYS/PACK) IMPLANT
WATER STERILE IRR 1000ML POUR (IV SOLUTION) ×6 IMPLANT

## 2015-04-28 NOTE — Anesthesia Procedure Notes (Signed)
Procedure Name: Intubation Date/Time: 04/28/2015 7:41 AM Performed by: Melina Copa, DAVID R Pre-anesthesia Checklist: Patient identified, Emergency Drugs available, Suction available, Patient being monitored and Timeout performed Patient Re-evaluated:Patient Re-evaluated prior to inductionOxygen Delivery Method: Circle system utilized Preoxygenation: Pre-oxygenation with 100% oxygen Intubation Type: IV induction Ventilation: Mask ventilation without difficulty Laryngoscope Size: Mac and 3 Grade View: Grade I Tube type: Oral Tube size: 7.5 mm Number of attempts: 1 Airway Equipment and Method: Stylet Placement Confirmation: ETT inserted through vocal cords under direct vision and positive ETCO2 Secured at: 21 cm Tube secured with: Tape Dental Injury: Teeth and Oropharynx as per pre-operative assessment

## 2015-04-28 NOTE — Anesthesia Preprocedure Evaluation (Addendum)
Anesthesia Evaluation  Patient identified by MRN, date of birth, ID band Patient awake    Reviewed: Allergy & Precautions, NPO status , Patient's Chart, lab work & pertinent test results  History of Anesthesia Complications Negative for: history of anesthetic complications  Airway Mallampati: II  TM Distance: >3 FB Neck ROM: Full    Dental  (+) Edentulous Upper, Edentulous Lower, Lower Dentures, Upper Dentures, Dental Advisory Given   Pulmonary neg pulmonary ROS, former smoker,    breath sounds clear to auscultation       Cardiovascular hypertension,  Rhythm:Regular Rate:Normal     Neuro/Psych    GI/Hepatic negative GI ROS, Neg liver ROS,   Endo/Other  negative endocrine ROS  Renal/GU negative Renal ROS     Musculoskeletal  (+) Arthritis ,   Abdominal   Peds  Hematology negative hematology ROS (+)   Anesthesia Other Findings   Reproductive/Obstetrics                           Anesthesia Physical Anesthesia Plan  ASA: II  Anesthesia Plan: General   Post-op Pain Management:    Induction: Intravenous  Airway Management Planned: Oral ETT  Additional Equipment: None  Intra-op Plan:   Post-operative Plan: Extubation in OR  Informed Consent: I have reviewed the patients History and Physical, chart, labs and discussed the procedure including the risks, benefits and alternatives for the proposed anesthesia with the patient or authorized representative who has indicated his/her understanding and acceptance.   Dental advisory given  Plan Discussed with: CRNA, Anesthesiologist and Surgeon  Anesthesia Plan Comments:        Anesthesia Quick Evaluation

## 2015-04-28 NOTE — Interval H&P Note (Signed)
OK for surgery PD 

## 2015-04-28 NOTE — Transfer of Care (Signed)
Immediate Anesthesia Transfer of Care Note  Patient: Ashley Lamb  Procedure(s) Performed: Procedure(s): TOTAL HIP ARTHROPLASTY ANTERIOR APPROACH (Right)  Patient Location: PACU  Anesthesia Type:General  Level of Consciousness: awake and patient cooperative  Airway & Oxygen Therapy: Patient Spontanous Breathing and Patient connected to nasal cannula oxygen  Post-op Assessment: Report given to RN, Post -op Vital signs reviewed and stable and Patient moving all extremities  Post vital signs: Reviewed and stable  Last Vitals:  Filed Vitals:   04/28/15 0546  BP: 146/76  Pulse: 70  Temp: 36.5 C  Resp: 20    Complications: No apparent anesthesia complications

## 2015-04-28 NOTE — Progress Notes (Signed)
Utilization review completed.  

## 2015-04-28 NOTE — Anesthesia Postprocedure Evaluation (Signed)
  Anesthesia Post-op Note  Patient: Ashley Lamb  Procedure(s) Performed: Procedure(s): TOTAL HIP ARTHROPLASTY ANTERIOR APPROACH (Right)  Patient Location: PACU  Anesthesia Type:General  Level of Consciousness: awake and alert   Airway and Oxygen Therapy: Patient Spontanous Breathing  Post-op Pain: mild  Post-op Assessment: Post-op Vital signs reviewed     RLE Motor Response: Purposeful movement, Responds to commands (wiggles toes)        Post-op Vital Signs: stable  Last Vitals:  Filed Vitals:   04/28/15 1200  BP: 98/59  Pulse: 58  Temp:   Resp: 10    Complications: No apparent anesthesia complications

## 2015-04-28 NOTE — Op Note (Signed)
PRE-OP DIAGNOSIS:  RIGHT HIP DEGENERATIVE JOINT DISEASE POST-OP DIAGNOSIS:  same PROCEDURE: RIGHT TOTAL HIP ARTHROPLASTY ANTERIOR APPROACH ANESTHESIA:  General SURGEON:  Melrose Nakayama MD ASSISTANT:  Loni Dolly PA-C   INDICATIONS FOR PROCEDURE:  The patient is a 74 y.o. female with a long history of a painful hip.  This has persisted despite multiple conservative measures.  The patient has persisted with pain and dysfunction making rest and activity difficult.  A total hip replacement is offered as surgical treatment.  Informed operative consent was obtained after discussion of possible complications including reaction to anesthesia, infection, neurovascular injury, dislocation, DVT, PE, and death.  The importance of the postoperative rehab program to optimize result was stressed with the patient.  SUMMARY OF FINDINGS AND PROCEDURE:  Under general anesthesia through a anterior approach an the Hana table a right THR was performed.  The patient had severe degenerative change and excellent bone quality.  We used DePuy components to replace the hip and these were size KA 12 [place] femur capped with a +8.5 36 mm metal hip ball.  On the acetabular side we used a size 52 Gription shell with a  plus 0 neutral polyethylene liner.  We did not use a hole eliminator.  Loni Dolly PA-C assisted throughout and was invaluable to the completion of the case in that he helped position and retract while I performed the procedure.  He also closed simultaneously to help minimize OR time.  I used fluoroscopy throughout the case to check position of implants and leg lengths and read all of these views myself.  DESCRIPTION OF PROCEDURE:  The patient was taken to the OR suite where general anesthetic was applied.  The patient was then positioned on the Hana table supine.  All bony prominences were appropriately padded.  Prep and drape was then performed in normal sterile fashion.  The patient was given kefzol preoperative  antibiotic and an appropriate time out was performed.  We then took an anterior approach to the right hip.  Dissection was taken through adipose to the tensor fascia lata fascia.  This structure was incised longitudinally and we dissected in the intermuscular interval just medial to this muscle.  Cobra retractors were placed superior and inferior to the femoral neck superficial to the capsule.  A capsular incision was then made and the retractors were placed along the femoral neck.  Xray was brought in to get a good level for the femoral neck cut which was made with an oscillating saw and osteotome.  The femoral head was removed with a corkscrew.  The acetabulum was exposed and some labral tissues were excised. Reaming was taken to the inside wall of the pelvis and sequentially up to 1 mm smaller than the actual component.  A trial of components was done and then the aforementioned acetabular shell was placed in appropriate tilt and anteversion confirmed by fluoroscopy. The liner was placed along with the hole eliminator and attention was turned to the femur.  The leg was brought down and over into adduction and the elevator bar was used to raise the femur up gently in the wound.  The piriformis was released with care taken to preserve the obturator internus attachment and all of the posterior capsule. The femur was reamed and then broached to the appropriate size.  A trial reduction was done and the aforementioned head and neck assembly gave [place] the best stability in extension with external rotation.  Leg lengths were felt to be about equal by  fluoroscopic exam.  The trial components were removed and the wound irrigated.  We then placed the femoral component in appropriate anteversion.  The head was applied to a dry stem neck and the hip again reduced.  It was again stable in the aforementioned position.  The would was irrigated again followed by re-approximation of anterior capsule with ethibond suture. Tensor  fascia was repaired with V-loc suture  followed by subcutaneous closure with #O and #2 undyed vicryl.  Skin was closed with subQ stitch and steristrips followed by a sterile dressing.  EBL and IOF can be obtained from anesthesia records.  DISPOSITION:  The patient was extubated in the OR and taken to PACU in stable condition to be admitted to the Orthopedic Surgery for appropriate post-op care to include perioperative antibiotics and DVT prophylaxis.

## 2015-04-29 ENCOUNTER — Encounter (HOSPITAL_COMMUNITY): Payer: Self-pay | Admitting: Orthopaedic Surgery

## 2015-04-29 LAB — BASIC METABOLIC PANEL
Anion gap: 8 (ref 5–15)
BUN: 15 mg/dL (ref 6–20)
CHLORIDE: 102 mmol/L (ref 101–111)
CO2: 25 mmol/L (ref 22–32)
Calcium: 8.3 mg/dL — ABNORMAL LOW (ref 8.9–10.3)
Creatinine, Ser: 1.09 mg/dL — ABNORMAL HIGH (ref 0.44–1.00)
GFR calc Af Amer: 57 mL/min — ABNORMAL LOW (ref >60)
GFR calc non Af Amer: 49 mL/min — ABNORMAL LOW (ref >60)
GLUCOSE: 115 mg/dL — AB (ref 65–99)
POTASSIUM: 3.8 mmol/L (ref 3.5–5.1)
Sodium: 135 mmol/L (ref 135–145)

## 2015-04-29 LAB — CBC
HCT: 25.1 % — ABNORMAL LOW (ref 36.0–46.0)
HEMOGLOBIN: 8.2 g/dL — AB (ref 12.0–15.0)
MCH: 28.4 pg (ref 26.0–34.0)
MCHC: 32.7 g/dL (ref 30.0–36.0)
MCV: 86.9 fL (ref 78.0–100.0)
Platelets: 144 10*3/uL — ABNORMAL LOW (ref 150–400)
RBC: 2.89 MIL/uL — AB (ref 3.87–5.11)
RDW: 17.2 % — ABNORMAL HIGH (ref 11.5–15.5)
WBC: 7.2 10*3/uL (ref 4.0–10.5)

## 2015-04-29 NOTE — Progress Notes (Signed)
Subjective: 1 Day Post-Op Procedure(s) (LRB): TOTAL HIP ARTHROPLASTY ANTERIOR APPROACH (Right)   Ashley Lamb up resting comfortably in bed eatign breakfast. She was unable to void and had an in and out cath at 01:00.  Activity level:  wbat Diet tolerance:  eatign well Voiding:  ok Patient reports pain as mild and moderate.    Objective: Vital signs in last 24 hours: Temp:  [97.5 F (36.4 C)-97.8 F (36.6 C)] 97.5 F (36.4 C) (09/28 0446) Pulse Rate:  [55-68] 68 (09/28 0446) Resp:  [7-18] 18 (09/28 0446) BP: (92-151)/(55-73) 92/56 mmHg (09/28 0446) SpO2:  [90 %-100 %] 100 % (09/28 0446)  Labs:  Recent Labs  04/29/15 0535  HGB 8.2*    Recent Labs  04/29/15 0535  WBC 7.2  RBC 2.89*  HCT 25.1*  PLT 144*    Recent Labs  04/29/15 0535  NA 135  K 3.8  CL 102  CO2 25  BUN 15  CREATININE 1.09*  GLUCOSE 115*  CALCIUM 8.3*   No results for input(s): LABPT, INR in the last 72 hours.  Physical Exam:  Neurologically intact ABD soft Neurovascular intact Sensation intact distally Intact pulses distally Dorsiflexion/Plantar flexion intact Incision: dressing C/D/I and no drainage No cellulitis present Compartment soft  Assessment/Plan:  1 Day Post-Op Procedure(s) (LRB): TOTAL HIP ARTHROPLASTY ANTERIOR APPROACH (Right) Advance diet Up with therapy D/C IV fluids Plan for discharge tomorrow Discharge home with home health if doing well and cleared by PT. Follow up in office 2 weeks post op. Continue on ASA 325mg  BID x 4 weeks post op. Patient will try to continue to void on her own and an in and out cath can be done if indicated. Patiens Hgb is currently 8.2 but is asymptomatic at this time so we will continue to monitor.   NIDA, Larwance Sachs 04/29/2015, 7:53 AM

## 2015-04-29 NOTE — Care Management Note (Signed)
Case Management Note  Patient Details  Name: Ashley Lamb MRN: [id number] Date of Birth: 08-Mar-1941  Subjective/Objective:         S/p right total hip arthroplasty           Action/Plan: Set up with [facility] for HHPT by MD office. OT recommended HHOT, contacted [place] at MD office, received approval for [place]. Contacted Miranda at [facility] and set up [place] in addition to [place]. Spoke with patient, no change in discharge plan. Patient stated that she has a rolling walker, needs 3N1, declined tub bench which was recommended by OT. Contacted Jermaine at [facility] and requested 3N1 be delivered to patient's room. Patient stated that she will have family to assist after discharge.      Expected Discharge Date:                  Expected Discharge Plan:  [place]  In-House Referral:  NA  Discharge planning Services  CM Consult  Post Acute Care Choice:  Durable Medical Equipment, Home Health Choice offered to:  Patient  DME Arranged:  3-N-1 DME Agency:  [place]:  PT, OT [facility] Agency:  [place]  Status of Service:     Medicare Important Message Given:    Date Medicare IM Given:    Medicare IM give by:    Date Additional Medicare IM Given:    Additional Medicare Important Message give by:     If discussed at [place] of Stay Meetings, dates discussed:    Additional Comments:  Lynne, [place], RN 04/29/2015, 2:06 PM

## 2015-04-29 NOTE — Evaluation (Signed)
Occupational Therapy Evaluation Patient Details Name: Ashley Lamb MRN: [id number] DOB: 1941-02-02 Today's Date: 04/29/2015    History of Present Illness Pt presents for Right THA. Had left THA last yr as well as h/o back sx and HTN   Clinical Impression   Pt admitted to hospital due to reason stated above. Pt currently with functional limitiations due to the deficits listed below (see OT problem list). Prior to admission pt was independent with most ADLs, however required assistance from husband for LB dressing. Pt currently requires supervision to max assistance for safety with ADLs. Pt reports husband will provided assistance 24/7 and daughter will assist PRN. Pt will benefit from skilled OT to increase independence and safety with ADLs and balance to allow safe discharge home.    Follow Up Recommendations  Home health OT    Equipment Recommendations  Tub/shower bench    Recommendations for Other Services       Precautions / Restrictions Precautions Precautions: Fall Precaution Booklet Issued: Precaution Comments:  Restrictions Weight Bearing Restrictions: Yes RLE Weight Bearing: Weight bearing as tolerated      Mobility Bed Mobility Overal bed mobility: Needs Assistance Bed Mobility: Supine to Sit;Sit to Supine     Supine to sit: Max assist;HOB elevated Sit to supine: Max assist;HOB elevated   General bed mobility comments: Pt required assistance using bed sheet to assist with lifiting RLE off of bed and max A to postion trunk in upright position  Transfers Overall transfer level: Needs assistance Equipment used: Rolling walker (2 wheeled) Transfers: Sit to/from Stand Sit to Stand: Mod assist         General transfer comment: mod A for power up from bed and toilet    Balance Overall balance assessment: Needs assistance Sitting-balance support: Bilateral upper extremity supported;Feet supported Sitting balance-Leahy Scale: Good   Postural control:  Posterior lean Standing balance support: Single extremity supported;During functional activity Standing balance-Leahy Scale: Poor Standing balance comment: required UE support while standing at sink to perform grooming task                            ADL Overall ADL's : Needs assistance/impaired Eating/Feeding: Set up;Sitting   Grooming: Wash/dry hands;Oral care;Min guard;Standing Grooming Details (indicate cue type and reason): Pt leaned on sink countertop for support to complete grooming task Upper Body Bathing: Supervision/ safety;Sitting   Lower Body Bathing: Moderate assistance;Sit to/from stand   Upper Body Dressing : Supervision/safety;Sitting   Lower Body Dressing: Moderate assistance;Sit to/from stand   Toilet Transfer: Min guard;Cueing for Office manager Details (indicate cue type and reason): required verbal cues for hand placement and technique to relieve pressure/pain from Rt hip Toileting- Clothing Manipulation and Hygiene: Min guard;Sit to/from stand       Functional mobility during ADLs: Min guard;Rolling walker (Pt slow and steady with ambulation) General ADL Comments: PTA pt recieved assistane from husband for LB dressing (donning stockings)     Vision     Perception     Praxis      Pertinent Vitals/Pain Pain Assessment: 0-10 Pain Score: 7  Faces Pain Scale: Hurts whole lot Pain Location: Rt hip Pain Descriptors / Indicators: Discomfort Pain Intervention(s): Premedicated before session     Hand Dominance Right   Extremity/Trunk Assessment Upper Extremity Assessment Upper Extremity Assessment: Overall WFL for tasks assessed   Lower Extremity Assessment Lower Extremity Assessment: Defer to PT evaluation RLE Deficits / Details: hip flex 1/5, knee  ext 2+/5, knee flex 3/5, ankle WFL RLE: Unable to fully assess due to pain   Cervical / Trunk Assessment Cervical / Trunk Assessment: Normal   Communication  Communication Communication: No difficulties   Cognition Arousal/Alertness: Awake/alert Behavior During Therapy: WFL for tasks assessed/performed Overall Cognitive Status: Within Functional Limits for tasks assessed                     General Comments       Exercises Exercises: Total Joint     Shoulder Instructions      Home Living Family/patient expects to be discharged to:: Private residence Living Arrangements: Spouse/significant other Available Help at Discharge: Family;Available 24 hours/day (Daughter will provide assistance PRN) Type of Home: House Home Access: Level entry     Home Layout: One level     Bathroom Shower/Tub: Tub/shower unit Shower/tub characteristics: Curtain Biochemist, clinical: Handicapped height Bathroom Accessibility: Yes How Accessible: Accessible via walker Home Equipment: [place] - 2 wheels;Cane - single point;Bedside commode;Hand held shower head   Additional Comments: Pt currently drives      Prior Functioning/Environment Level of Independence: Needs assistance  Gait / Transfers Assistance Needed: used cane due to pain ADL's / Homemaking Assistance Needed: Pt sometimes requires assistance with donning stockings onto bil LE         OT Diagnosis: Generalized weakness;Acute pain   OT Problem List: Decreased strength;Decreased activity tolerance;Impaired balance (sitting and/or standing);Decreased knowledge of use of DME or AE;Pain   OT Treatment/Interventions: Self-care/ADL training;Therapeutic exercise;DME and/or AE instruction;Therapeutic activities;Patient/family education    OT Goals(Current goals can be found in the care plan section) Acute Rehab OT Goals Patient Stated Goal: to decrease pain and return home OT Goal Formulation: With patient Time For Goal Achievement: 05/13/15 Potential to Achieve Goals: Good ADL Goals Pt Will Perform Grooming: with supervision;standing Pt Will Perform Lower Body Bathing: with min  assist;with adaptive equipment;sit to/from stand Pt Will Perform Lower Body Dressing: with min assist;with adaptive equipment;sit to/from stand Pt Will Transfer to Toilet: with supervision;bedside commode Pt Will Perform Tub/Shower Transfer: Tub transfer;with mod assist;ambulating;tub bench;rolling walker Additional ADL Goal #1: Pt will be mod A for in and OOB for BADLs  OT Frequency: Min 2X/week   Barriers to D/C:            Co-evaluation              End of Session Equipment Utilized During Treatment: Gait belt;Rolling walker  Activity Tolerance: Patient tolerated treatment well;Patient limited by pain Patient left: in chair;with call bell/phone within reach;with family/visitor present   Time: 1000-1100 OT Time Calculation (min): 60 min Charges:  OT General Charges $OT Visit: 1 Procedure OT Evaluation $Initial OT Evaluation Tier I: 1 Procedure OT Treatments $Self Care/Home Management : 38-52 mins G-Codes:    Lin Landsman 01-May-2015, 12:02 PM

## 2015-04-29 NOTE — Evaluation (Signed)
Physical Therapy Evaluation Patient Details Name: Ashley Lamb MRN: [id number] DOB: 24-Sep-1940 Today's Date: 04/29/2015   History of Present Illness  Pt presents for Right THA. Had left THA last yr as well as h/o back sx and HTN  Clinical Impression  Pt is s/p THA resulting in the deficits listed below (see PT Problem List). Pt struggling with bed mobility and transfers due to pain, ambulated 25' with min A and RW.  Pt will benefit from skilled PT to increase their independence and safety with mobility to allow discharge to the venue listed below.      Follow Up Recommendations Home health PT;Supervision for mobility/OOB    Equipment Recommendations  None recommended by PT    Recommendations for Other Services       Precautions / Restrictions Precautions Precautions: None Restrictions Weight Bearing Restrictions: Yes RLE Weight Bearing: Weight bearing as tolerated      Mobility  Bed Mobility Overal bed mobility: Needs Assistance Bed Mobility: Supine to Sit     Supine to sit: Max assist     General bed mobility comments: pivoted to left side, needed max A to get hips to EOB. Has adjustable bed at home  Transfers Overall transfer level: Needs assistance Equipment used: Rolling walker (2 wheeled) Transfers: Sit to/from Stand Sit to Stand: Mod assist         General transfer comment: mod A from bed and to and from toilet, for power up  Ambulation/Gait Ambulation/Gait assistance: Min assist Ambulation Distance (Feet): 25 Feet Assistive device: Rolling walker (2 wheeled) Gait Pattern/deviations: Step-to pattern;Decreased step length - left;Decreased weight shift to right     General Gait Details: very slow, antalgic gait  Stairs            Wheelchair Mobility    Modified Rankin (Stroke Patients Only)       Balance Overall balance assessment: Needs assistance Sitting-balance support: Bilateral upper extremity supported;Feet supported Sitting  balance-Leahy Scale: Good   Postural control: Posterior lean Standing balance support: Bilateral upper extremity supported Standing balance-Leahy Scale: Poor                               Pertinent Vitals/Pain Pain Assessment: Faces Faces Pain Scale: Hurts whole lot Pain Location: right hip Pain Descriptors / Indicators: Aching Pain Intervention(s): Limited activity within patient's tolerance;Monitored during session    Home Living Family/patient expects to be discharged to:: Private residence Living Arrangements: Spouse/significant other Available Help at Discharge: Family;Available 24 hours/day Type of Home: House Home Access: Level entry     Home Layout: One level Home Equipment: Walker - 2 wheels;Cane - single point Additional Comments: pt's husband will be helping her. Had left hip done 2 yrs ago    Prior Function Level of Independence: Needs assistance   Gait / Transfers Assistance Needed: used cane due to pain  ADL's / Homemaking Assistance Needed: has needed less help with dressing since first THA but sometimes needed help lower body        Hand Dominance        Extremity/Trunk Assessment   Upper Extremity Assessment: Defer to OT evaluation           Lower Extremity Assessment: RLE deficits/detail RLE Deficits / Details: hip flex 1/5, knee ext 2+/5, knee flex 3/5, ankle WFL    Cervical / Trunk Assessment: Normal  Communication   Communication: No difficulties  Cognition Arousal/Alertness: Awake/alert Behavior During Therapy:  WFL for tasks assessed/performed Overall Cognitive Status: Within Functional Limits for tasks assessed                      General Comments General comments (skin integrity, edema, etc.): O2 sats 94% on RA    Exercises Total Joint Exercises Ankle Circles/Pumps: AROM;Both;10 reps;Seated      Assessment/Plan    PT Assessment Patient needs continued PT services  PT Diagnosis Difficulty  walking;Acute pain;Abnormality of gait   PT Problem List Decreased range of motion;Decreased strength;Decreased activity tolerance;Decreased balance;Decreased mobility;Decreased knowledge of use of DME;Decreased knowledge of precautions;Pain  PT Treatment Interventions DME instruction;Gait training;Functional mobility training;Therapeutic activities;Therapeutic exercise;Balance training;Patient/family education   PT Goals (Current goals can be found in the Care Plan section) Acute Rehab PT Goals Patient Stated Goal: return home PT Goal Formulation: With patient Time For Goal Achievement: 05/06/15 Potential to Achieve Goals: Good    Frequency BID   Barriers to discharge        Co-evaluation               End of Session Equipment Utilized During Treatment: Gait belt Activity Tolerance: Patient tolerated treatment well Patient left: in chair;with call bell/phone within reach Nurse Communication: Mobility status         Time: 7530-0511 PT Time Calculation (min) (ACUTE ONLY): 35 min   Charges:   PT Evaluation $Initial PT Evaluation Tier I: 1 Procedure PT Treatments $Therapeutic Activity: 8-22 mins   PT G Codes:      Leighton Roach, PT  Acute Rehab Services  Omer, Tony 04/29/2015, 9:59 AM

## 2015-04-29 NOTE — Progress Notes (Signed)
Physical Therapy Treatment Patient Details Name: Ashley Lamb MRN: [id number] DOB: April 11, 1941 Today's Date: 04/29/2015    History of Present Illness Pt presents for Right THA. Had left THA last yr as well as h/o back sx and HTN    PT Comments    Pt right hip very sore this afternoon but pt able to ambulate 30' with min A as well as perform THA exercises. Husband present for session. PT will continue to follow.   Follow Up Recommendations  Home health PT;Supervision for mobility/OOB     Equipment Recommendations  None recommended by PT    Recommendations for Other Services       Precautions / Restrictions Precautions Precautions: None Precaution Booklet Issued: No Precaution Comments: "no precautions" stated in order set Restrictions Weight Bearing Restrictions: Yes RLE Weight Bearing: Weight bearing as tolerated    Mobility  Bed Mobility Overal bed mobility: Needs Assistance Bed Mobility: Sit to Supine       Sit to supine: Mod assist   General bed mobility comments: mod A to RLE for return to supine from sitting as well as scooting in the bed  Transfers Overall transfer level: Needs assistance Equipment used: Rolling walker (2 wheeled) Transfers: Sit to/from Stand Sit to Stand: Min guard         General transfer comment: RW stabilized by therapist but pt able to rise without power up from therapist, min-guard for safety  Ambulation/Gait Ambulation/Gait assistance: Min guard Ambulation Distance (Feet): 30 Feet Assistive device: Rolling walker (2 wheeled) Gait Pattern/deviations: Step-to pattern;Decreased step length - right;Decreased weight shift to right;Decreased stance time - right Gait velocity: very slow Gait velocity interpretation: <1.8 ft/sec, indicative of risk for recurrent falls General Gait Details: increased pain during ambulation   Stairs            Wheelchair Mobility    Modified Rankin (Stroke Patients Only)       Balance  Overall balance assessment: Needs assistance Sitting-balance support: No upper extremity supported Sitting balance-Leahy Scale: Good     Standing balance support: Bilateral upper extremity supported Standing balance-Leahy Scale: Poor Standing balance comment: unable to stand without UE support                    Cognition Arousal/Alertness: Awake/alert Behavior During Therapy: WFL for tasks assessed/performed Overall Cognitive Status: Within Functional Limits for tasks assessed                      Exercises Total Joint Exercises Ankle Circles/Pumps: AROM;Both;10 reps;Seated Quad Sets: AROM;Both;10 reps;Seated Gluteal Sets: AROM;10 reps;Seated Heel Slides: AAROM;Right;10 reps;Supine Hip ABduction/ADduction: AAROM;Right;10 reps;Supine;Standing;AROM Straight Leg Raises: AAROM;Right;10 reps;Supine Long Arc Quad: AROM;Right;10 reps;Seated Knee Flexion: AROM;Right;10 reps;Standing Marching in Standing: AROM;Right;10 reps;Standing Standing Hip Extension: AROM;Right;10 reps;Standing    General Comments        Pertinent Vitals/Pain Pain Assessment: Faces Faces Pain Scale: Hurts even more Pain Location: right hip Pain Intervention(s): Patient requesting pain meds-RN notified;RN gave pain meds during session;Monitored during session    Home Living                      Prior Function            PT Goals (current goals can now be found in the care plan section) Acute Rehab PT Goals Patient Stated Goal: to decrease pain and return home PT Goal Formulation: With patient Time For Goal Achievement: 05/06/15 Potential to Achieve Goals: Good  Progress towards PT goals: Progressing toward goals    Frequency  BID    PT Plan Current plan remains appropriate    Co-evaluation             End of Session Equipment Utilized During Treatment: Gait belt Activity Tolerance: Patient tolerated treatment well Patient left: in bed;in CPM;with family/visitor  present     Time: 5329-9242 PT Time Calculation (min) (ACUTE ONLY): 34 min  Charges:  $Gait Training: 8-22 mins $Therapeutic Exercise: 8-22 mins                    G Codes:     Leighton Roach, PT  Acute Rehab Services  [phone]  Leighton Roach 04/29/2015, 3:28 PM

## 2015-04-29 NOTE — Progress Notes (Signed)
Patient unable to void. Up to bsc to void with no success. Bladder scan showed ~772ml. In and out cath done got out 831ml of clear yellow urine. Patient due to void.

## 2015-04-30 LAB — CBC
HEMATOCRIT: 26.6 % — AB (ref 36.0–46.0)
Hemoglobin: 8.5 g/dL — ABNORMAL LOW (ref 12.0–15.0)
MCH: 27.9 pg (ref 26.0–34.0)
MCHC: 32 g/dL (ref 30.0–36.0)
MCV: 87.2 fL (ref 78.0–100.0)
Platelets: 162 10*3/uL (ref 150–400)
RBC: 3.05 MIL/uL — AB (ref 3.87–5.11)
RDW: 17.7 % — ABNORMAL HIGH (ref 11.5–15.5)
WBC: 8.5 10*3/uL (ref 4.0–10.5)

## 2015-04-30 MED ORDER — METHOCARBAMOL 500 MG PO TABS: 500.0000 mg | ORAL_TABLET | Freq: Four times a day (QID) | ORAL | Status: DC | PRN

## 2015-04-30 MED ORDER — ASPIRIN 325 MG PO TBEC: 325.0000 mg | DELAYED_RELEASE_TABLET | Freq: Two times a day (BID) | ORAL | Status: AC

## 2015-04-30 MED ORDER — HYDROCODONE-ACETAMINOPHEN 5-325 MG PO TABS: 1.0000 | ORAL_TABLET | ORAL | Status: DC | PRN

## 2015-04-30 NOTE — Progress Notes (Signed)
Occupational Therapy Treatment Patient Details Name: Ashley Lamb MRN: [id number] DOB: [birth year] Today's Date: 04/30/2015    History of present illness Pt presents for Right THA. Had left THA last yr as well as h/o back sx and HTN   OT comments  Focus of session included tub transfer and education/demonstration of adaptive equipment for lower body dressing and bathing required for ADL independence. Pt appeared unsteady transferring to tub using 3-in-1, therapist recommended tub transfer bench to increase pt's safety and decrease risk of pt falling. Pt expressed concern of cost due to transfer bench not being covered by insurance. Pt reported family members will be able to assist her in and out of tub using 3-in-1. Pt able to return safe demonstration of sock-aid and reacher, however stated family will also assist with lower body dressing as needed.  Follow Up Recommendations  Home health OT    Equipment Recommendations  Tub/shower bench    Recommendations for Other Services      Precautions / Restrictions Precautions Precautions: None Precaution Comments: "no precautions" stated in order set Restrictions Weight Bearing Restrictions: Yes RLE Weight Bearing: Weight bearing as tolerated       Mobility Bed Mobility Overal bed mobility: Needs Assistance Bed Mobility: Supine to Sit     Supine to sit: Min guard;HOB elevated Sit to supine: Min assist   General bed mobility comments: Pt requires increase time for completion and used bed sheet to lift Rt LE off of bed  Transfers   Equipment used: Rolling walker (2 wheeled) Transfers: Sit to/from Stand Sit to Stand: Min guard         General transfer comment: Min guard for safety    Balance Overall balance assessment: Needs assistance Sitting-balance support: No upper extremity supported;Feet supported Sitting balance-Leahy Scale: Good     Standing balance support: Bilateral upper extremity supported Standing  balance-Leahy Scale: Poor Standing balance comment: Unable to stand without UE support                   ADL Overall ADL's : Needs assistance/impaired                                 Tub/ Shower Transfer: Min guard;Ambulation;Rolling walker;3 in 1   Functional mobility during ADLs: Min guard;Rolling walker General ADL Comments: Demonstration provided for tub transfer using 3-in-1 and tub transfer bench. Pt educated in utilization of adaptive equipment.      Vision                     Perception     Praxis      Cognition   Behavior During Therapy: WFL for tasks assessed/performed Overall Cognitive Status: Within Functional Limits for tasks assessed                       Extremity/Trunk Assessment               Exercises    Shoulder Instructions       General Comments      Pertinent Vitals/ Pain       Pain Assessment: 0-10 Pain Score: 7  Pain Location: Rt hip Pain Descriptors / Indicators: Grimacing;Guarding;Sore Pain Intervention(s): Monitored during session;Repositioned;Patient requesting pain meds-RN notified  Home Living  Prior Functioning/Environment              Frequency Min 2X/week     Progress Toward Goals  OT Goals(current goals can now be found in the care plan section)  Progress towards OT goals: Progressing toward goals  Acute Rehab OT Goals Patient Stated Goal: go home OT Goal Formulation: With patient Time For Goal Achievement: 05/13/15 Potential to Achieve Goals: Good ADL Goals Pt Will Perform Grooming: with supervision;standing Pt Will Perform Lower Body Bathing: with min assist;with adaptive equipment;sit to/from stand Pt Will Perform Lower Body Dressing: with min assist;with adaptive equipment;sit to/from stand Pt Will Transfer to Toilet: with supervision;bedside commode Pt Will Perform Tub/Shower Transfer: Tub transfer;with mod  assist;ambulating;tub bench;rolling walker Additional ADL Goal #1: Pt will be mod A for in and OOB for BADLs  Plan Discharge plan remains appropriate    Co-evaluation                 End of Session Equipment Utilized During Treatment: Gait belt;Rolling walker   Activity Tolerance Patient tolerated treatment well   Patient Left in chair;with call bell/phone within reach;with nursing/sitter in room;with family/visitor present   Nurse Communication Patient requests pain meds        Time: 3086-5784 OT Time Calculation (min): 37 min  Charges: OT General Charges $OT Visit: 1 Procedure OT Treatments $Self Care/Home Management : 23-37 mins  Lin Landsman 04/30/2015, 1:35 PM

## 2015-04-30 NOTE — Progress Notes (Signed)
Physical Therapy Treatment Patient Details Name: Ashley Lamb MRN: [id number] DOB: [birth year] Today's Date: 04/30/2015    History of Present Illness Pt presents for Right THA. Had left THA last yr as well as h/o back sx and HTN    PT Comments    Pt's desire is to d/c home today. She will have 24-hour assist from family (spouse and daughter). She confirms they will be able to provide min assist level for her mobility.   Follow Up Recommendations  Home health PT;Supervision for mobility/OOB     Equipment Recommendations  None recommended by PT    Recommendations for Other Services       Precautions / Restrictions Precautions Precautions: None Precaution Comments: "no precautions" stated in order set Restrictions Weight Bearing Restrictions: Yes RLE Weight Bearing: Weight bearing as tolerated    Mobility  Bed Mobility         Supine to sit: Min assist;HOB elevated Sit to supine: Min assist   General bed mobility comments: increased time required to complete with min assist. Pt has an adjustable frame bed at home.  Transfers   Equipment used: Rolling walker (2 wheeled)   Sit to Stand: Min guard         General transfer comment: min guard for safety. Verbal cues for hand placement  Ambulation/Gait Ambulation/Gait assistance: Min guard Ambulation Distance (Feet): 150 Feet Assistive device: Rolling walker (2 wheeled) Gait Pattern/deviations: Step-through pattern;Decreased stride length Gait velocity: very slow       Stairs            Wheelchair Mobility    Modified Rankin (Stroke Patients Only)       Balance                                    Cognition Arousal/Alertness: Awake/alert Behavior During Therapy: WFL for tasks assessed/performed Overall Cognitive Status: Within Functional Limits for tasks assessed                      Exercises Total Joint Exercises Ankle Circles/Pumps: AROM;Both;10 reps;Supine Quad  Sets: AROM;Both;10 reps;Supine Gluteal Sets: AROM;Both;10 reps;Supine Heel Slides: AAROM;Right;10 reps;Supine Hip ABduction/ADduction: AAROM;Right;10 reps;Supine    General Comments        Pertinent Vitals/Pain Pain Assessment: 0-10 Pain Score: 3  Pain Location: R hip Pain Descriptors / Indicators: Sore Pain Intervention(s): Monitored during session;Premedicated before session    Home Living                      Prior Function            PT Goals (current goals can now be found in the care plan section) Acute Rehab PT Goals Patient Stated Goal: home today PT Goal Formulation: With patient Time For Goal Achievement: 05/06/15 Potential to Achieve Goals: Good Progress towards PT goals: Progressing toward goals    Frequency  7X/week    PT Plan Current plan remains appropriate    Co-evaluation             End of Session Equipment Utilized During Treatment: Gait belt Activity Tolerance: Patient tolerated treatment well Patient left: in bed;with call bell/phone within reach     Time: 0930-0954 PT Time Calculation (min) (ACUTE ONLY): 24 min  Charges:  $Gait Training: 8-22 mins $Therapeutic Exercise: 8-22 mins  G Codes:      Lorriane Shire 04/30/2015, 10:03 AM

## 2015-04-30 NOTE — Discharge Summary (Signed)
Patient ID: Ashley Lamb MRN: [id number] DOB/AGE: 1941/03/16 74 y.o.  Admit date: 04/28/2015 Discharge date: 04/30/2015  Admission Diagnoses:  Principal Problem:   Primary osteoarthritis of right hip   Discharge Diagnoses:  Same  Past Medical History  Diagnosis Date  . Hypertension   . Arthritis   . Pneumonia     remote history  . History of kidney stones     Surgeries: Procedure(s): TOTAL HIP ARTHROPLASTY ANTERIOR APPROACH on 04/28/2015   Consultants:    Discharged Condition: Improved  Hospital Course: Ashley Lamb is an 74 y.o. female who was admitted 04/28/2015 for operative treatment ofPrimary osteoarthritis of right hip. Patient has severe unremitting pain that affects sleep, daily activities, and work/hobbies. After pre-op clearance the patient was taken to the operating room on 04/28/2015 and underwent  Procedure(s): TOTAL HIP ARTHROPLASTY ANTERIOR APPROACH.    Patient was given perioperative antibiotics: Anti-infectives    Start     Dose/Rate Route Frequency Ordered Stop   04/28/15 1915  ceFAZolin (ANCEF) IVPB 2 g/50 mL premix     2 g 100 mL/hr over 30 Minutes Intravenous Every 6 hours 04/28/15 1907 04/29/15 0241   04/28/15 0700  ceFAZolin (ANCEF) IVPB 2 g/50 mL premix     2 g 100 mL/hr over 30 Minutes Intravenous To ShortStay [facility] 04/27/15 1057 04/28/15 0733       Patient was given sequential compression devices, early ambulation, and chemoprophylaxis to prevent DVT.  Patient benefited maximally from hospital stay and there were no complications.    Recent vital signs: Patient Vitals for the past 24 hrs:  BP Temp Temp src Pulse Resp SpO2  04/30/15 1300 (!) 94/53 mmHg 97.1 F (36.2 C) Oral 72 18 94 %  04/30/15 0958 (!) 151/68 mmHg - - 88 - -  04/30/15 0500 (!) 151/68 mmHg 98.9 F (37.2 C) Oral 88 18 98 %  04/29/15 2044 131/61 mmHg 98.2 F (36.8 C) Oral 83 19 97 %  04/29/15 1630 100/60 mmHg 97.1 F (36.2 C) Oral 62 18 100 %     Recent laboratory  studies:  Recent Labs  04/29/15 0535 04/30/15 0353  WBC 7.2 8.5  HGB 8.2* 8.5*  HCT 25.1* 26.6*  PLT 144* 162  NA 135  --   K 3.8  --   CL 102  --   CO2 25  --   BUN 15  --   CREATININE 1.09*  --   GLUCOSE 115*  --   CALCIUM 8.3*  --      Discharge Medications:     Medication List    STOP taking these medications        aspirin 81 MG tablet  Replaced by:  aspirin 325 MG EC tablet      TAKE these medications        amLODipine 5 MG tablet  Commonly known as:  NORVASC  Take 5 mg by mouth daily.     aspirin 325 MG EC tablet  Take 1 tablet (325 mg total) by mouth 2 (two) times daily after a meal.     estradiol 1 MG tablet  Commonly known as:  ESTRACE  Take 1 mg by mouth daily.     hydrochlorothiazide 25 MG tablet  Commonly known as:  HYDRODIURIL  Take 12.5 mg by mouth daily.     HYDROcodone-acetaminophen 5-325 MG tablet  Commonly known as:  NORCO/VICODIN  Take 1-2 tablets by mouth every 4 (four) hours as needed for moderate pain.  methocarbamol 500 MG tablet  Commonly known as:  ROBAXIN  Take 1 tablet (500 mg total) by mouth every 6 (six) hours as needed for muscle spasms.     metoprolol 50 MG tablet  Commonly known as:  LOPRESSOR  Take 50 mg by mouth 2 (two) times daily.     potassium chloride 10 MEQ tablet  Commonly known as:  K-DUR,KLOR-CON  Take 10 mEq by mouth 2 (two) times daily.     Vitamin D 2000 UNITS Caps  Take 2,000 Units by mouth daily.        Diagnostic Studies: Dg Hip Operative Unilat With Pelvis Right  04/28/2015   CLINICAL DATA:  Anterior right hip replacement  EXAM: OPERATIVE RIGHT HIP () 2 VIEWS  TECHNIQUE: Fluoroscopic spot image(s) were submitted for interpretation post-operatively.  Fluoroscopy time 0 minutes 22 seconds  COMPARISON:  None.  FINDINGS: Complete right hip replacement with femoral and acetabular components in anticipated position.  IMPRESSION: Postoperative change   Electronically Signed   By: Skipper Cliche M.D.    On: 04/28/2015 11:06    Disposition: 06-Home-Health Care Svc      Discharge Instructions    Call MD / Call 911    Complete by:  As directed   If you experience chest pain or shortness of breath, CALL 911 and be transported to the hospital emergency room.  If you develope a fever above 101 F, pus (white drainage) or increased drainage or redness at the wound, or calf pain, call your surgeon's office.     Constipation Prevention    Complete by:  As directed   Drink plenty of fluids.  Prune juice may be helpful.  You may use a stool softener, such as Colace (over the counter) 100 mg twice a day.  Use MiraLax (over the counter) for constipation as needed.     Diet - low sodium heart healthy    Complete by:  As directed      Discharge instructions    Complete by:  As directed   INSTRUCTIONS AFTER JOINT REPLACEMENT   Remove items at home which could result in a fall. This includes throw rugs or furniture in walking pathways ICE to the affected joint every three hours while awake for 30 minutes at a time, for at least the first 3-5 days, and then as needed for pain and swelling.  Continue to use ice for pain and swelling. You may notice swelling that will progress down to the foot and ankle.  This is normal after surgery.  Elevate your leg when you are not up walking on it.   Continue to use the breathing machine you got in the hospital (incentive spirometer) which will help keep your temperature down.  It is common for your temperature to cycle up and down following surgery, especially at night when you are not up moving around and exerting yourself.  The breathing machine keeps your lungs expanded and your temperature down.   DIET:  As you were doing prior to hospitalization, we recommend a well-balanced diet.  DRESSING / WOUND CARE / SHOWERING  You may shower 3 days after surgery, but keep the wounds dry during showering.  You may use an occlusive plastic wrap (Press'n Seal for example), NO  SOAKING/SUBMERGING IN THE BATHTUB.  If the bandage gets wet, change with a clean dry gauze.  If the incision gets wet, pat the wound dry with a clean towel.  ACTIVITY  Increase activity slowly as tolerated, but follow  the weight bearing instructions below.   No driving for 6 weeks or until further direction given by your physician.  You cannot drive while taking narcotics.  No lifting or carrying greater than 10 lbs. until further directed by your surgeon. Avoid periods of inactivity such as sitting longer than an hour when not asleep. This helps prevent blood clots.  You may return to work once you are authorized by your doctor.     WEIGHT BEARING   Weight bearing as tolerated with assist device (walker, cane, etc) as directed, use it as long as suggested by your surgeon or therapist, typically at least 4-6 weeks.   EXERCISES  Results after joint replacement surgery are often greatly improved when you follow the exercise, range of motion and muscle strengthening exercises prescribed by your doctor. Safety measures are also important to protect the joint from further injury. Any time any of these exercises cause you to have increased pain or swelling, decrease what you are doing until you are comfortable again and then slowly increase them. If you have problems or questions, call your caregiver or physical therapist for advice.   Rehabilitation is important following a joint replacement. After just a few days of immobilization, the muscles of the leg can become weakened and shrink (atrophy).  These exercises are designed to build up the tone and strength of the thigh and leg muscles and to improve motion. Often times heat used for twenty to thirty minutes before working out will loosen up your tissues and help with improving the range of motion but do not use heat for the first two weeks following surgery (sometimes heat can increase post-operative swelling).   These exercises can be done on a  training (exercise) mat, on the floor, on a table or on a bed. Use whatever works the best and is most comfortable for you.    Use music or television while you are exercising so that the exercises are a pleasant break in your day. This will make your life better with the exercises acting as a break in your routine that you can look forward to.   Perform all exercises about fifteen times, three times per day or as directed.  You should exercise both the operative leg and the other leg as well.   Exercises include:   Quad Sets - Tighten up the muscle on the front of the thigh (Quad) and hold for 5-10 seconds.   Straight Leg Raises - With your knee straight (if you were given a brace, keep it on), lift the leg to 60 degrees, hold for 3 seconds, and slowly lower the leg.  Perform this exercise against resistance later as your leg gets stronger.  Leg Slides: Lying on your back, slowly slide your foot toward your buttocks, bending your knee up off the floor (only go as far as is comfortable). Then slowly slide your foot back down until your leg is flat on the floor again.  Angel Wings: Lying on your back spread your legs to the side as far apart as you can without causing discomfort.  Hamstring Strength:  Lying on your back, push your heel against the floor with your leg straight by tightening up the muscles of your buttocks.  Repeat, but this time bend your knee to a comfortable angle, and push your heel against the floor.  You may put a pillow under the heel to make it more comfortable if necessary.   A rehabilitation program following joint replacement surgery  can speed recovery and prevent re-injury in the future due to weakened muscles. Contact your doctor or a physical therapist for more information on knee rehabilitation.    CONSTIPATION  Constipation is defined medically as fewer than three stools per week and severe constipation as less than one stool per week.  Even if you have a regular bowel  pattern at home, your normal regimen is likely to be disrupted due to multiple reasons following surgery.  Combination of anesthesia, postoperative narcotics, change in appetite and fluid intake all can affect your bowels.   YOU MUST use at least one of the following options; they are listed in order of increasing strength to get the job done.  They are all available over the counter, and you may need to use some, POSSIBLY even all of these options:    Drink plenty of fluids (prune juice may be helpful) and high fiber foods Colace 100 mg by mouth twice a day  Senokot for constipation as directed and as needed Dulcolax (bisacodyl), take with full glass of water  Miralax (polyethylene glycol) once or twice a day as needed.  If you have tried all these things and are unable to have a bowel movement in the first 3-4 days after surgery call either your surgeon or your primary doctor.    If you experience loose stools or diarrhea, hold the medications until you stool forms back up.  If your symptoms do not get better within 1 week or if they get worse, check with your doctor.  If you experience "the worst abdominal pain ever" or develop nausea or vomiting, please contact the office immediately for further recommendations for treatment.   ITCHING:  If you experience itching with your medications, try taking only a single pain pill, or even half a pain pill at a time.  You can also use Benadryl over the counter for itching or also to help with sleep.   TED HOSE STOCKINGS:  Use stockings on both legs until for at least 2 weeks or as directed by physician office. They may be removed at night for sleeping.  MEDICATIONS:  See your medication summary on the "After Visit Summary" that nursing will review with you.  You may have some home medications which will be placed on hold until you complete the course of blood thinner medication.  It is important for you to complete the blood thinner medication as  prescribed.  PRECAUTIONS:  If you experience chest pain or shortness of breath - call 911 immediately for transfer to the hospital emergency department.   If you develop a fever greater that 101 F, purulent drainage from wound, increased redness or drainage from wound, foul odor from the wound/dressing, or calf pain - CONTACT YOUR SURGEON.                                                   FOLLOW-UP APPOINTMENTS:  If you do not already have a post-op appointment, please call the office for an appointment to be seen by your surgeon.  Guidelines for how soon to be seen are listed in your "After Visit Summary", but are typically between 1-4 weeks after surgery.  OTHER INSTRUCTIONS:   Knee Replacement:  Do not place pillow under knee, focus on keeping the knee straight while resting. CPM instructions: 0-90 degrees, 2 hours in  the morning, 2 hours in the afternoon, and 2 hours in the evening. Place foam block, curve side up under heel at all times except when in CPM or when walking.  DO NOT modify, tear, cut, or change the foam block in any way.  MAKE SURE YOU:  Understand these instructions.  Get help right away if you are not doing well or get worse.    Thank you for letting us be a part of your medical care team.  It is a privilege we respect greatly.  We hope these instructions will help you stay on track for a fast and full recovery!     Increase activity slowly as tolerated    Complete by:  As directed            Follow-up Information    Follow up with Hessie Dibble, MD. Schedule an appointment as soon as possible for a visit in 2 weeks.   Specialty:  Orthopedic Surgery   Contact information:   [place] [address] [phone]       Follow up with [facility].   Why:  They will contact you to schedule home therapy visits.   Contact information:   [address] [place] [address] [phone]        Signed: Rich Fuchs 04/30/2015, 3:06 PM

## 2015-05-01 DIAGNOSIS — Z8701 Personal history of pneumonia (recurrent): Secondary | ICD-10-CM | POA: Diagnosis not present

## 2015-05-01 DIAGNOSIS — Z471 Aftercare following joint replacement surgery: Secondary | ICD-10-CM | POA: Diagnosis not present

## 2015-05-01 DIAGNOSIS — I10 Essential (primary) hypertension: Secondary | ICD-10-CM | POA: Diagnosis not present

## 2015-05-01 DIAGNOSIS — Z87891 Personal history of nicotine dependence: Secondary | ICD-10-CM | POA: Diagnosis not present

## 2015-05-01 DIAGNOSIS — Z96641 Presence of right artificial hip joint: Secondary | ICD-10-CM | POA: Diagnosis not present

## 2015-05-01 DIAGNOSIS — Z7982 Long term (current) use of aspirin: Secondary | ICD-10-CM | POA: Diagnosis not present

## 2015-05-04 DIAGNOSIS — Z8701 Personal history of pneumonia (recurrent): Secondary | ICD-10-CM | POA: Diagnosis not present

## 2015-05-04 DIAGNOSIS — Z96641 Presence of right artificial hip joint: Secondary | ICD-10-CM | POA: Diagnosis not present

## 2015-05-04 DIAGNOSIS — Z87891 Personal history of nicotine dependence: Secondary | ICD-10-CM | POA: Diagnosis not present

## 2015-05-04 DIAGNOSIS — Z7982 Long term (current) use of aspirin: Secondary | ICD-10-CM | POA: Diagnosis not present

## 2015-05-04 DIAGNOSIS — Z471 Aftercare following joint replacement surgery: Secondary | ICD-10-CM | POA: Diagnosis not present

## 2015-05-04 DIAGNOSIS — I10 Essential (primary) hypertension: Secondary | ICD-10-CM | POA: Diagnosis not present

## 2015-05-06 DIAGNOSIS — I10 Essential (primary) hypertension: Secondary | ICD-10-CM | POA: Diagnosis not present

## 2015-05-06 DIAGNOSIS — Z471 Aftercare following joint replacement surgery: Secondary | ICD-10-CM | POA: Diagnosis not present

## 2015-05-06 DIAGNOSIS — Z7982 Long term (current) use of aspirin: Secondary | ICD-10-CM | POA: Diagnosis not present

## 2015-05-06 DIAGNOSIS — Z87891 Personal history of nicotine dependence: Secondary | ICD-10-CM | POA: Diagnosis not present

## 2015-05-06 DIAGNOSIS — Z96641 Presence of right artificial hip joint: Secondary | ICD-10-CM | POA: Diagnosis not present

## 2015-05-06 DIAGNOSIS — Z8701 Personal history of pneumonia (recurrent): Secondary | ICD-10-CM | POA: Diagnosis not present

## 2015-05-07 DIAGNOSIS — Z7982 Long term (current) use of aspirin: Secondary | ICD-10-CM | POA: Diagnosis not present

## 2015-05-07 DIAGNOSIS — Z8701 Personal history of pneumonia (recurrent): Secondary | ICD-10-CM | POA: Diagnosis not present

## 2015-05-07 DIAGNOSIS — Z96641 Presence of right artificial hip joint: Secondary | ICD-10-CM | POA: Diagnosis not present

## 2015-05-07 DIAGNOSIS — Z471 Aftercare following joint replacement surgery: Secondary | ICD-10-CM | POA: Diagnosis not present

## 2015-05-07 DIAGNOSIS — I10 Essential (primary) hypertension: Secondary | ICD-10-CM | POA: Diagnosis not present

## 2015-05-07 DIAGNOSIS — Z87891 Personal history of nicotine dependence: Secondary | ICD-10-CM | POA: Diagnosis not present

## 2015-05-11 DIAGNOSIS — Z96641 Presence of right artificial hip joint: Secondary | ICD-10-CM | POA: Diagnosis not present

## 2015-05-11 DIAGNOSIS — Z96643 Presence of artificial hip joint, bilateral: Secondary | ICD-10-CM | POA: Diagnosis not present

## 2015-05-11 DIAGNOSIS — Z471 Aftercare following joint replacement surgery: Secondary | ICD-10-CM | POA: Diagnosis not present

## 2015-05-11 DIAGNOSIS — Z8701 Personal history of pneumonia (recurrent): Secondary | ICD-10-CM | POA: Diagnosis not present

## 2015-05-11 DIAGNOSIS — Z7982 Long term (current) use of aspirin: Secondary | ICD-10-CM | POA: Diagnosis not present

## 2015-05-11 DIAGNOSIS — I10 Essential (primary) hypertension: Secondary | ICD-10-CM | POA: Diagnosis not present

## 2015-05-11 DIAGNOSIS — Z87891 Personal history of nicotine dependence: Secondary | ICD-10-CM | POA: Diagnosis not present

## 2015-05-13 DIAGNOSIS — Z96641 Presence of right artificial hip joint: Secondary | ICD-10-CM | POA: Diagnosis not present

## 2015-05-13 DIAGNOSIS — Z8701 Personal history of pneumonia (recurrent): Secondary | ICD-10-CM | POA: Diagnosis not present

## 2015-05-13 DIAGNOSIS — Z7982 Long term (current) use of aspirin: Secondary | ICD-10-CM | POA: Diagnosis not present

## 2015-05-13 DIAGNOSIS — I10 Essential (primary) hypertension: Secondary | ICD-10-CM | POA: Diagnosis not present

## 2015-05-13 DIAGNOSIS — Z471 Aftercare following joint replacement surgery: Secondary | ICD-10-CM | POA: Diagnosis not present

## 2015-05-13 DIAGNOSIS — Z87891 Personal history of nicotine dependence: Secondary | ICD-10-CM | POA: Diagnosis not present

## 2015-05-15 DIAGNOSIS — Z471 Aftercare following joint replacement surgery: Secondary | ICD-10-CM | POA: Diagnosis not present

## 2015-05-15 DIAGNOSIS — I10 Essential (primary) hypertension: Secondary | ICD-10-CM | POA: Diagnosis not present

## 2015-05-15 DIAGNOSIS — Z96641 Presence of right artificial hip joint: Secondary | ICD-10-CM | POA: Diagnosis not present

## 2015-05-15 DIAGNOSIS — Z87891 Personal history of nicotine dependence: Secondary | ICD-10-CM | POA: Diagnosis not present

## 2015-05-15 DIAGNOSIS — Z8701 Personal history of pneumonia (recurrent): Secondary | ICD-10-CM | POA: Diagnosis not present

## 2015-05-15 DIAGNOSIS — Z7982 Long term (current) use of aspirin: Secondary | ICD-10-CM | POA: Diagnosis not present

## 2015-05-18 DIAGNOSIS — Z96641 Presence of right artificial hip joint: Secondary | ICD-10-CM | POA: Diagnosis not present

## 2015-05-18 DIAGNOSIS — M25551 Pain in right hip: Secondary | ICD-10-CM | POA: Diagnosis not present

## 2015-05-18 DIAGNOSIS — R262 Difficulty in walking, not elsewhere classified: Secondary | ICD-10-CM | POA: Diagnosis not present

## 2015-05-18 DIAGNOSIS — M25651 Stiffness of right hip, not elsewhere classified: Secondary | ICD-10-CM | POA: Diagnosis not present

## 2015-05-20 DIAGNOSIS — M25651 Stiffness of right hip, not elsewhere classified: Secondary | ICD-10-CM | POA: Diagnosis not present

## 2015-05-20 DIAGNOSIS — Z96641 Presence of right artificial hip joint: Secondary | ICD-10-CM | POA: Diagnosis not present

## 2015-05-20 DIAGNOSIS — R262 Difficulty in walking, not elsewhere classified: Secondary | ICD-10-CM | POA: Diagnosis not present

## 2015-05-20 DIAGNOSIS — M25551 Pain in right hip: Secondary | ICD-10-CM | POA: Diagnosis not present

## 2015-05-26 DIAGNOSIS — M25651 Stiffness of right hip, not elsewhere classified: Secondary | ICD-10-CM | POA: Diagnosis not present

## 2015-05-26 DIAGNOSIS — Z96641 Presence of right artificial hip joint: Secondary | ICD-10-CM | POA: Diagnosis not present

## 2015-05-26 DIAGNOSIS — R262 Difficulty in walking, not elsewhere classified: Secondary | ICD-10-CM | POA: Diagnosis not present

## 2015-05-26 DIAGNOSIS — M25551 Pain in right hip: Secondary | ICD-10-CM | POA: Diagnosis not present

## 2015-05-27 DIAGNOSIS — Z96643 Presence of artificial hip joint, bilateral: Secondary | ICD-10-CM | POA: Diagnosis not present

## 2015-05-27 DIAGNOSIS — Z471 Aftercare following joint replacement surgery: Secondary | ICD-10-CM | POA: Diagnosis not present

## 2015-05-28 DIAGNOSIS — M25551 Pain in right hip: Secondary | ICD-10-CM | POA: Diagnosis not present

## 2015-05-28 DIAGNOSIS — M25651 Stiffness of right hip, not elsewhere classified: Secondary | ICD-10-CM | POA: Diagnosis not present

## 2015-05-28 DIAGNOSIS — R262 Difficulty in walking, not elsewhere classified: Secondary | ICD-10-CM | POA: Diagnosis not present

## 2015-05-28 DIAGNOSIS — Z96641 Presence of right artificial hip joint: Secondary | ICD-10-CM | POA: Diagnosis not present

## 2015-06-01 DIAGNOSIS — R262 Difficulty in walking, not elsewhere classified: Secondary | ICD-10-CM | POA: Diagnosis not present

## 2015-06-01 DIAGNOSIS — M25651 Stiffness of right hip, not elsewhere classified: Secondary | ICD-10-CM | POA: Diagnosis not present

## 2015-06-01 DIAGNOSIS — Z96641 Presence of right artificial hip joint: Secondary | ICD-10-CM | POA: Diagnosis not present

## 2015-06-01 DIAGNOSIS — M25551 Pain in right hip: Secondary | ICD-10-CM | POA: Diagnosis not present

## 2015-06-03 DIAGNOSIS — M25551 Pain in right hip: Secondary | ICD-10-CM | POA: Diagnosis not present

## 2015-06-03 DIAGNOSIS — M25651 Stiffness of right hip, not elsewhere classified: Secondary | ICD-10-CM | POA: Diagnosis not present

## 2015-06-03 DIAGNOSIS — R262 Difficulty in walking, not elsewhere classified: Secondary | ICD-10-CM | POA: Diagnosis not present

## 2015-06-03 DIAGNOSIS — Z96641 Presence of right artificial hip joint: Secondary | ICD-10-CM | POA: Diagnosis not present

## 2015-06-04 DIAGNOSIS — N951 Menopausal and female climacteric states: Secondary | ICD-10-CM | POA: Diagnosis not present

## 2015-06-04 DIAGNOSIS — M150 Primary generalized (osteo)arthritis: Secondary | ICD-10-CM | POA: Diagnosis not present

## 2015-06-04 DIAGNOSIS — Z23 Encounter for immunization: Secondary | ICD-10-CM | POA: Diagnosis not present

## 2015-06-04 DIAGNOSIS — I10 Essential (primary) hypertension: Secondary | ICD-10-CM | POA: Diagnosis not present

## 2015-06-04 DIAGNOSIS — E559 Vitamin D deficiency, unspecified: Secondary | ICD-10-CM | POA: Diagnosis not present

## 2015-06-08 DIAGNOSIS — R262 Difficulty in walking, not elsewhere classified: Secondary | ICD-10-CM | POA: Diagnosis not present

## 2015-06-08 DIAGNOSIS — M25651 Stiffness of right hip, not elsewhere classified: Secondary | ICD-10-CM | POA: Diagnosis not present

## 2015-06-08 DIAGNOSIS — Z96641 Presence of right artificial hip joint: Secondary | ICD-10-CM | POA: Diagnosis not present

## 2015-06-08 DIAGNOSIS — M25551 Pain in right hip: Secondary | ICD-10-CM | POA: Diagnosis not present

## 2015-06-15 DIAGNOSIS — R262 Difficulty in walking, not elsewhere classified: Secondary | ICD-10-CM | POA: Diagnosis not present

## 2015-06-15 DIAGNOSIS — M25551 Pain in right hip: Secondary | ICD-10-CM | POA: Diagnosis not present

## 2015-06-15 DIAGNOSIS — Z96641 Presence of right artificial hip joint: Secondary | ICD-10-CM | POA: Diagnosis not present

## 2015-06-15 DIAGNOSIS — M25651 Stiffness of right hip, not elsewhere classified: Secondary | ICD-10-CM | POA: Diagnosis not present

## 2015-06-17 DIAGNOSIS — M25551 Pain in right hip: Secondary | ICD-10-CM | POA: Diagnosis not present

## 2015-06-17 DIAGNOSIS — Z96641 Presence of right artificial hip joint: Secondary | ICD-10-CM | POA: Diagnosis not present

## 2015-06-17 DIAGNOSIS — M25651 Stiffness of right hip, not elsewhere classified: Secondary | ICD-10-CM | POA: Diagnosis not present

## 2015-06-17 DIAGNOSIS — R262 Difficulty in walking, not elsewhere classified: Secondary | ICD-10-CM | POA: Diagnosis not present

## 2015-06-22 DIAGNOSIS — R262 Difficulty in walking, not elsewhere classified: Secondary | ICD-10-CM | POA: Diagnosis not present

## 2015-06-22 DIAGNOSIS — M25651 Stiffness of right hip, not elsewhere classified: Secondary | ICD-10-CM | POA: Diagnosis not present

## 2015-06-22 DIAGNOSIS — M25551 Pain in right hip: Secondary | ICD-10-CM | POA: Diagnosis not present

## 2015-06-22 DIAGNOSIS — Z96641 Presence of right artificial hip joint: Secondary | ICD-10-CM | POA: Diagnosis not present

## 2015-06-24 DIAGNOSIS — M25551 Pain in right hip: Secondary | ICD-10-CM | POA: Diagnosis not present

## 2015-06-29 DIAGNOSIS — R262 Difficulty in walking, not elsewhere classified: Secondary | ICD-10-CM | POA: Diagnosis not present

## 2015-06-29 DIAGNOSIS — M25551 Pain in right hip: Secondary | ICD-10-CM | POA: Diagnosis not present

## 2015-06-29 DIAGNOSIS — M25651 Stiffness of right hip, not elsewhere classified: Secondary | ICD-10-CM | POA: Diagnosis not present

## 2015-06-29 DIAGNOSIS — Z96641 Presence of right artificial hip joint: Secondary | ICD-10-CM | POA: Diagnosis not present

## 2015-07-01 DIAGNOSIS — R262 Difficulty in walking, not elsewhere classified: Secondary | ICD-10-CM | POA: Diagnosis not present

## 2015-07-01 DIAGNOSIS — Z96641 Presence of right artificial hip joint: Secondary | ICD-10-CM | POA: Diagnosis not present

## 2015-07-01 DIAGNOSIS — M25651 Stiffness of right hip, not elsewhere classified: Secondary | ICD-10-CM | POA: Diagnosis not present

## 2015-07-01 DIAGNOSIS — M25551 Pain in right hip: Secondary | ICD-10-CM | POA: Diagnosis not present

## 2015-07-06 DIAGNOSIS — Z96641 Presence of right artificial hip joint: Secondary | ICD-10-CM | POA: Diagnosis not present

## 2015-07-06 DIAGNOSIS — R262 Difficulty in walking, not elsewhere classified: Secondary | ICD-10-CM | POA: Diagnosis not present

## 2015-07-06 DIAGNOSIS — M25551 Pain in right hip: Secondary | ICD-10-CM | POA: Diagnosis not present

## 2015-07-06 DIAGNOSIS — M25651 Stiffness of right hip, not elsewhere classified: Secondary | ICD-10-CM | POA: Diagnosis not present

## 2015-07-08 DIAGNOSIS — M25551 Pain in right hip: Secondary | ICD-10-CM | POA: Diagnosis not present

## 2015-07-08 DIAGNOSIS — Z96641 Presence of right artificial hip joint: Secondary | ICD-10-CM | POA: Diagnosis not present

## 2015-07-08 DIAGNOSIS — R262 Difficulty in walking, not elsewhere classified: Secondary | ICD-10-CM | POA: Diagnosis not present

## 2015-07-08 DIAGNOSIS — M25651 Stiffness of right hip, not elsewhere classified: Secondary | ICD-10-CM | POA: Diagnosis not present

## 2015-07-15 DIAGNOSIS — M25551 Pain in right hip: Secondary | ICD-10-CM | POA: Diagnosis not present

## 2015-07-15 DIAGNOSIS — M1712 Unilateral primary osteoarthritis, left knee: Secondary | ICD-10-CM | POA: Diagnosis not present

## 2015-09-03 DIAGNOSIS — H2513 Age-related nuclear cataract, bilateral: Secondary | ICD-10-CM | POA: Diagnosis not present

## 2015-12-11 ENCOUNTER — Other Ambulatory Visit: Payer: Self-pay | Admitting: Family Medicine

## 2015-12-11 DIAGNOSIS — E559 Vitamin D deficiency, unspecified: Secondary | ICD-10-CM | POA: Diagnosis not present

## 2015-12-11 DIAGNOSIS — H5462 Unqualified visual loss, left eye, normal vision right eye: Secondary | ICD-10-CM | POA: Diagnosis not present

## 2015-12-11 DIAGNOSIS — M150 Primary generalized (osteo)arthritis: Secondary | ICD-10-CM | POA: Diagnosis not present

## 2015-12-11 DIAGNOSIS — E7800 Pure hypercholesterolemia, unspecified: Secondary | ICD-10-CM | POA: Diagnosis not present

## 2015-12-11 DIAGNOSIS — N951 Menopausal and female climacteric states: Secondary | ICD-10-CM | POA: Diagnosis not present

## 2015-12-11 DIAGNOSIS — I10 Essential (primary) hypertension: Secondary | ICD-10-CM | POA: Diagnosis not present

## 2015-12-21 ENCOUNTER — Ambulatory Visit
Admission: RE | Admit: 2015-12-21 | Discharge: 2015-12-21 | Disposition: A | Discharge status: Home / Self Care | Payer: Medicare Other | Source: Ambulatory Visit | Attending: Family Medicine | Admitting: Family Medicine

## 2015-12-21 DIAGNOSIS — H538 Other visual disturbances: Secondary | ICD-10-CM | POA: Diagnosis not present

## 2015-12-21 DIAGNOSIS — H5462 Unqualified visual loss, left eye, normal vision right eye: Secondary | ICD-10-CM

## 2015-12-21 MED ORDER — GADOBENATE DIMEGLUMINE 529 MG/ML IV SOLN
20.0000 mL | Freq: Once | INTRAVENOUS | Status: AC | PRN
  Administered 2015-12-21: 20 mL via INTRAVENOUS

## 2016-05-17 DIAGNOSIS — Z23 Encounter for immunization: Secondary | ICD-10-CM | POA: Diagnosis not present

## 2016-06-06 DIAGNOSIS — I10 Essential (primary) hypertension: Secondary | ICD-10-CM | POA: Diagnosis not present

## 2016-06-06 DIAGNOSIS — M150 Primary generalized (osteo)arthritis: Secondary | ICD-10-CM | POA: Diagnosis not present

## 2016-06-06 DIAGNOSIS — N951 Menopausal and female climacteric states: Secondary | ICD-10-CM | POA: Diagnosis not present

## 2016-06-06 DIAGNOSIS — E559 Vitamin D deficiency, unspecified: Secondary | ICD-10-CM | POA: Diagnosis not present

## 2016-09-09 DIAGNOSIS — H25013 Cortical age-related cataract, bilateral: Secondary | ICD-10-CM | POA: Diagnosis not present

## 2016-09-20 DIAGNOSIS — H25812 Combined forms of age-related cataract, left eye: Secondary | ICD-10-CM | POA: Diagnosis not present

## 2016-09-20 DIAGNOSIS — H25811 Combined forms of age-related cataract, right eye: Secondary | ICD-10-CM | POA: Diagnosis not present

## 2016-09-20 DIAGNOSIS — H40011 Open angle with borderline findings, low risk, right eye: Secondary | ICD-10-CM | POA: Diagnosis not present

## 2016-09-28 DIAGNOSIS — H2511 Age-related nuclear cataract, right eye: Secondary | ICD-10-CM | POA: Diagnosis not present

## 2016-09-28 DIAGNOSIS — H25811 Combined forms of age-related cataract, right eye: Secondary | ICD-10-CM | POA: Diagnosis not present

## 2016-10-07 DIAGNOSIS — H2512 Age-related nuclear cataract, left eye: Secondary | ICD-10-CM | POA: Diagnosis not present

## 2016-10-19 DIAGNOSIS — H2512 Age-related nuclear cataract, left eye: Secondary | ICD-10-CM | POA: Diagnosis not present

## 2016-10-19 DIAGNOSIS — H25812 Combined forms of age-related cataract, left eye: Secondary | ICD-10-CM | POA: Diagnosis not present

## 2016-10-31 IMAGING — MR MR HEAD WO/W CM
10 of 12 series · 40 of 48 positions shown · IV contrast (multihance)
Comparison: Head CT 03/22/2013

CLINICAL DATA: Vision in the left eye goes dark on and off for
several months.

EXAM:
MRI HEAD WITHOUT AND WITH CONTRAST
TECHNIQUE: Multiplanar, multiecho pulse sequences of the brain and surrounding
structures were obtained without and with intravenous contrast.
Creatinine was obtained on site at [HOSPITAL] at [HOSPITAL].
Results: Creatinine 1 mg/dL.
CONTRAST:  20mL MULTIHANCE GADOBENATE DIMEGLUMINE 529 MG/ML IV SOLN

[Series 2: T1 · sagittal · 5.0mm · 0.45mm/px · 2 of 19 slices shown]
[im 1/19]
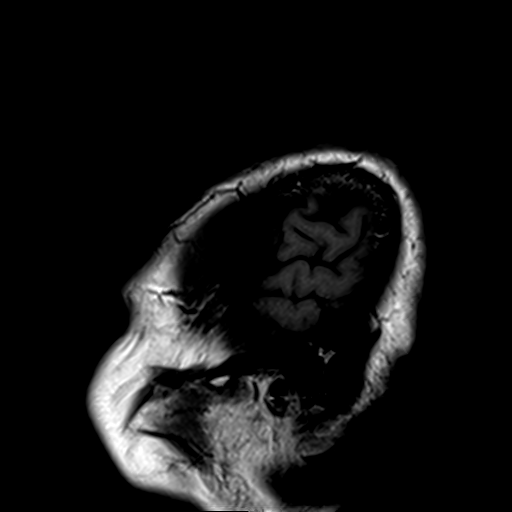
[im 19/19]
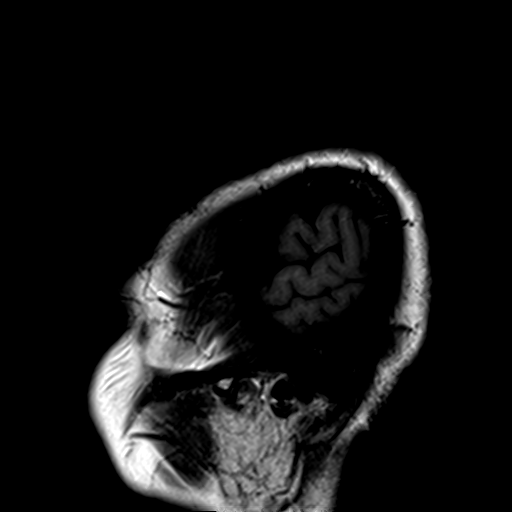

[Series 3: T2 · axial · 5.0mm · 0.45mm/px · z∈[-55,+81]mm · 2 of 22 slices shown (1 of 2)]
[im 1/22]
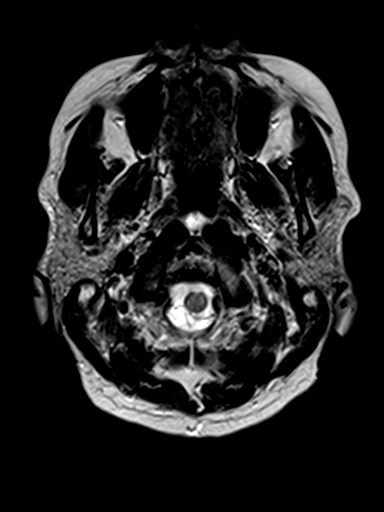
[im 22/22]
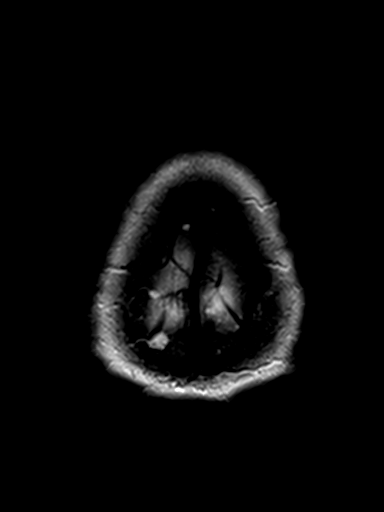

[Series 4: DWI · axial · 3.0mm · 0.94mm/px · z∈[-56,+84]mm · 8 of 96 slices shown (1 of 2)]
[im 1/96]
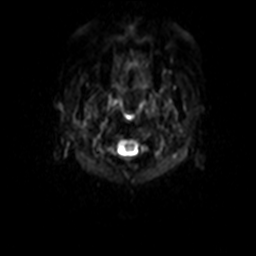
[im 14/96]
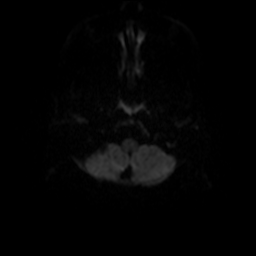
[im 28/96]
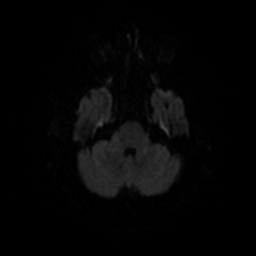
[im 41/96]
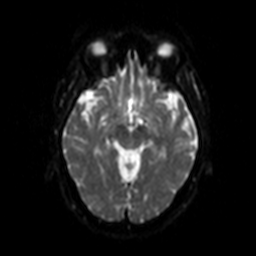
[im 55/96]
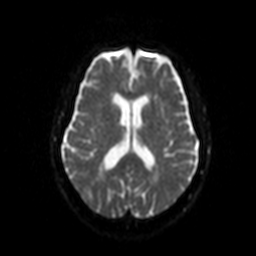
[im 68/96]
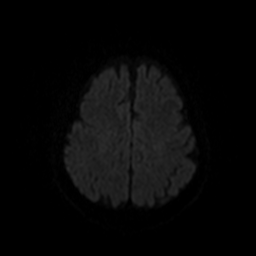
[im 82/96]
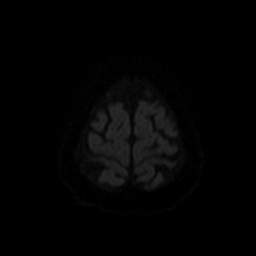
[im 96/96]
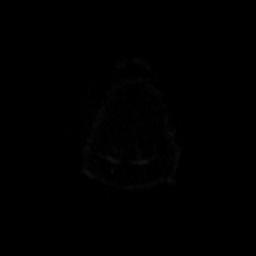

[Series 5: dwi_adc · axial · 3.0mm · 0.94mm/px · z∈[-56,+84]mm · 4 of 44 slices shown]
[im 1/44]
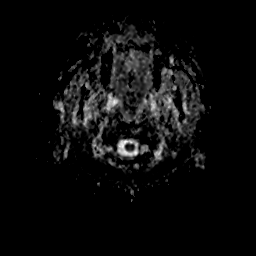
[im 15/44]
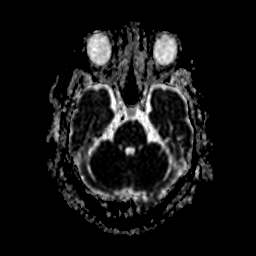
[im 29/44]
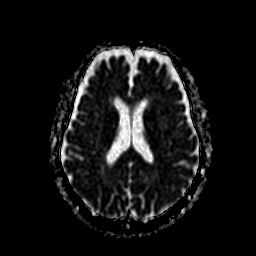
[im 44/44]
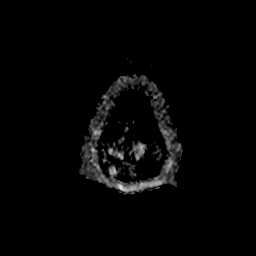

[Series 6: DWI · coronal · 5.0mm · 0.90mm/px · 5 of 64 slices shown (2 of 2)]
[im 1/64]
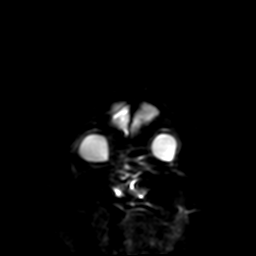
[im 16/64]
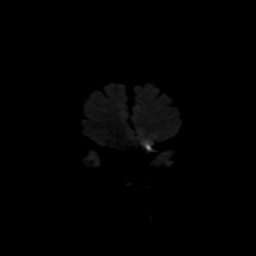
[im 32/64]
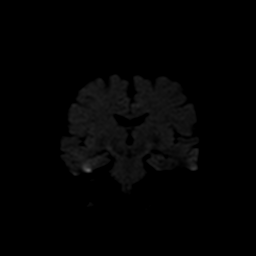
[im 48/64]
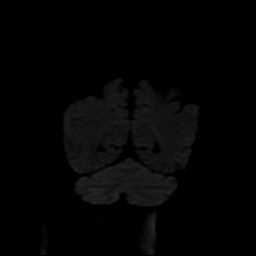
[im 64/64]
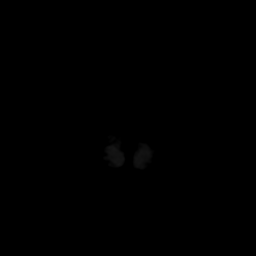

[Series 7: cor dwi_adc · coronal · 5.0mm · 0.90mm/px · 3 of 32 slices shown]
[im 1/32]
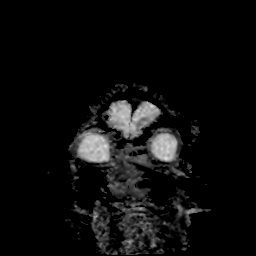
[im 16/32]
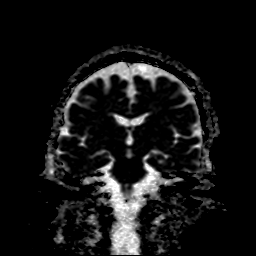
[im 32/32]
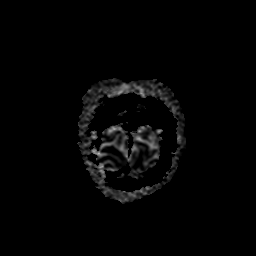

[Series 8: FLAIR · axial · 5.0mm · 0.45mm/px · z∈[-55,+81]mm · 2 of 22 slices shown]
[im 1/22]
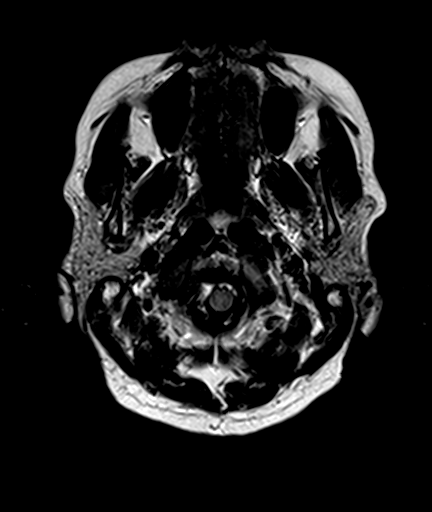
[im 22/22]
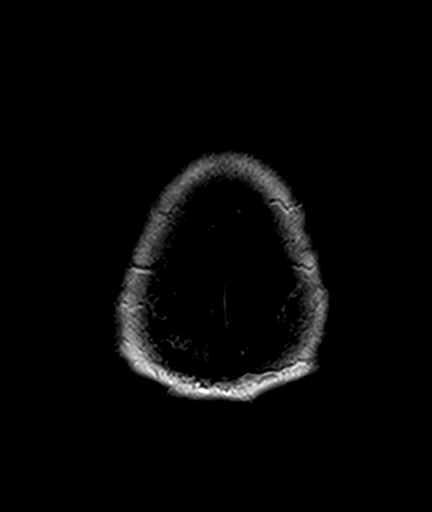

[Series 9: axial (person_name)1 volume · axial · 2.0mm · 0.45mm/px · z∈[-58,+83]mm · 6 of 72 slices shown]
[im 1/72]
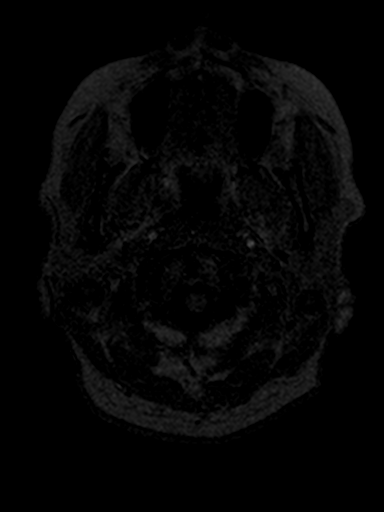
[im 15/72]
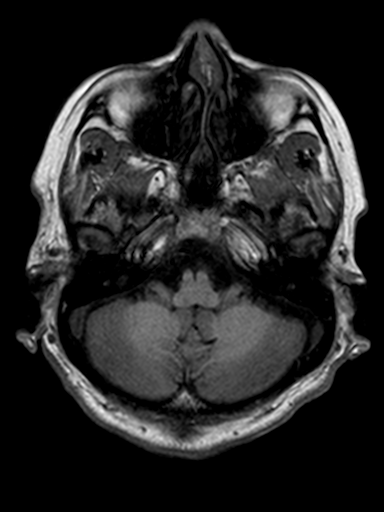
[im 29/72]
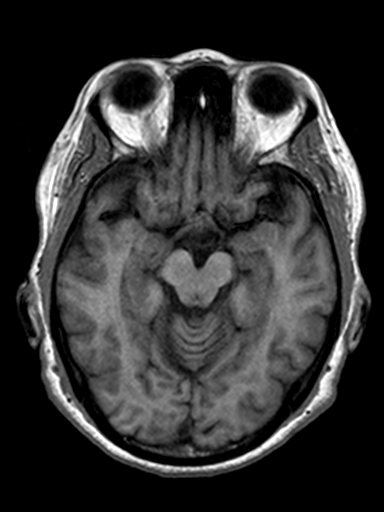
[im 43/72]
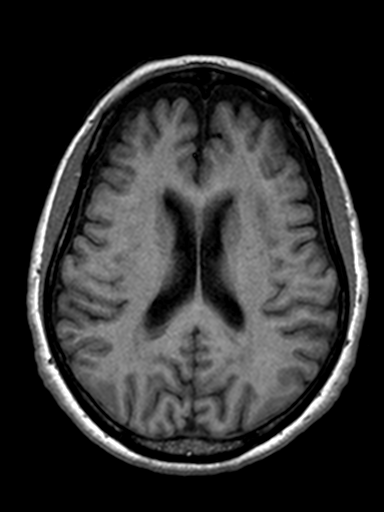
[im 57/72]
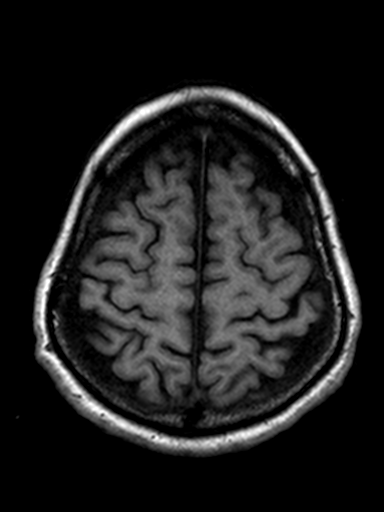
[im 72/72]
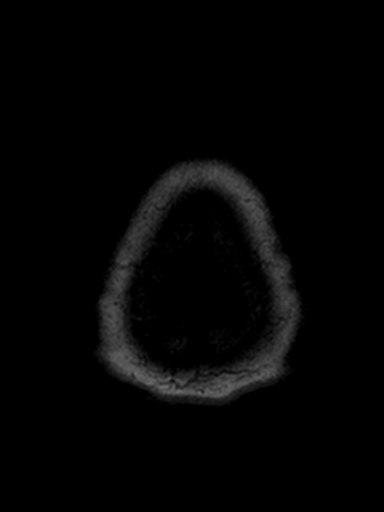

[Series 11: swi_images · axial · 2.0mm · 0.90mm/px · z∈[-58,+83]mm · 6 of 72 slices shown]
[im 1/72]
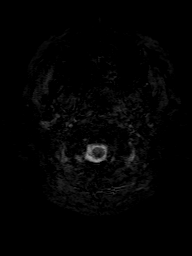
[im 15/72]
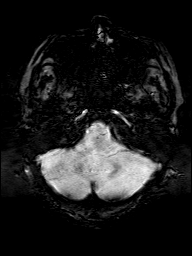
[im 29/72]
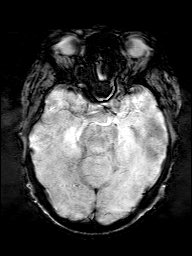
[im 43/72]
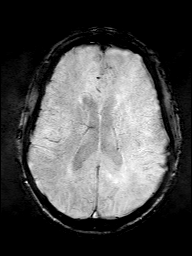
[im 57/72]
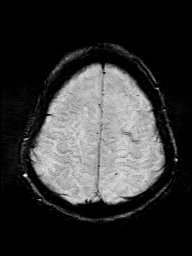
[im 72/72]
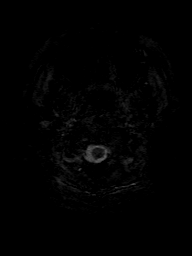

[Series 12: T2 · coronal · 5.0mm · 0.45mm/px · 2 of 24 slices shown (2 of 2)]
[im 1/24]
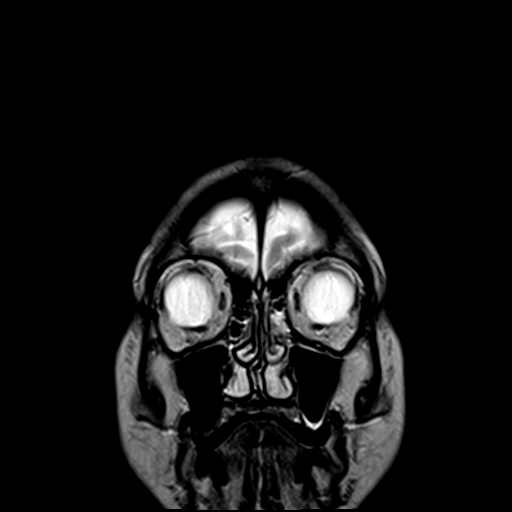
[im 24/24]
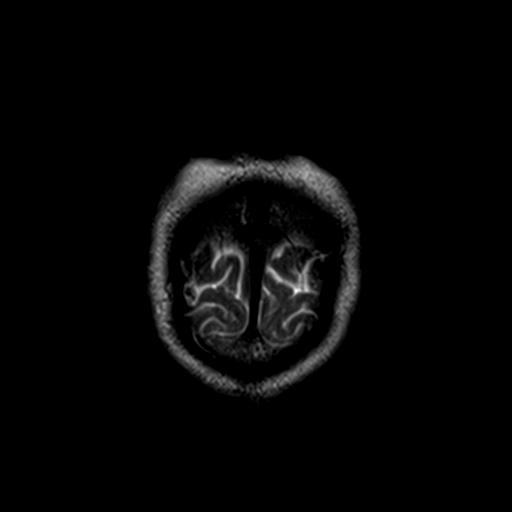

[40 of 48 positions shown; findings below may reference images not displayed]

FINDINGS: Calvarium and upper cervical spine: No focal marrow signal
abnormality.

Orbits: Negative.

Sinuses and Mastoids: Prominent adenoid tonsil with midline 13 mm
cyst containing fluid level, consistent with Tornwaldt cyst. No
acute sinusitis or mastoiditis.

Brain: No specific explanation for visual loss. There is no acute or
previous infarct, hemorrhage, hydrocephalus, or mass lesion. No
asymmetric vascular injury in the left hemisphere to correlate with
amaurosis fugax and chronic small emboli. Carotid Doppler would be
complementary. The suprasellar cistern is clear and there is no
signs of abnormal orbital mass. There is patchy deep cerebral white
matter FLAIR hyperintensity with confluent rim around the lateral
ventricles. Given patient's age and history of hypertension this is
likely mild chronic small vessel disease. No specific demyelinating
pattern. Incidental dilated perivascular spaces along the deep gray
nuclei, and developmental appearing hippocampal cyst on the left.
IMPRESSION: 1. No specific finding to explain visual loss, as above.
2. Mild for age white matter disease, usually chronic microvascular
ischemia.

## 2016-12-07 DIAGNOSIS — E7800 Pure hypercholesterolemia, unspecified: Secondary | ICD-10-CM | POA: Diagnosis not present

## 2016-12-07 DIAGNOSIS — M150 Primary generalized (osteo)arthritis: Secondary | ICD-10-CM | POA: Diagnosis not present

## 2016-12-07 DIAGNOSIS — I10 Essential (primary) hypertension: Secondary | ICD-10-CM | POA: Diagnosis not present

## 2016-12-07 DIAGNOSIS — E559 Vitamin D deficiency, unspecified: Secondary | ICD-10-CM | POA: Diagnosis not present

## 2016-12-07 DIAGNOSIS — N951 Menopausal and female climacteric states: Secondary | ICD-10-CM | POA: Diagnosis not present

## 2017-01-02 ENCOUNTER — Encounter: Payer: Self-pay | Admitting: Podiatry

## 2017-01-02 ENCOUNTER — Ambulatory Visit (INDEPENDENT_AMBULATORY_CARE_PROVIDER_SITE_OTHER): Payer: Medicare Other | Admitting: Podiatry

## 2017-01-02 ENCOUNTER — Ambulatory Visit (INDEPENDENT_AMBULATORY_CARE_PROVIDER_SITE_OTHER): Payer: Medicare Other

## 2017-01-02 DIAGNOSIS — R600 Localized edema: Secondary | ICD-10-CM | POA: Diagnosis not present

## 2017-01-02 DIAGNOSIS — M779 Enthesopathy, unspecified: Secondary | ICD-10-CM | POA: Diagnosis not present

## 2017-01-02 DIAGNOSIS — M79671 Pain in right foot: Secondary | ICD-10-CM | POA: Diagnosis not present

## 2017-01-02 MED ORDER — TRIAMCINOLONE ACETONIDE 10 MG/ML IJ SUSP
10.0000 mg | Freq: Once | INTRAMUSCULAR | Status: AC
  Administered 2017-01-02: 10 mg

## 2017-01-02 NOTE — Progress Notes (Signed)
   Subjective:    Patient ID: Ashley Lamb, female    DOB: [birth year], 76 y.o.   MRN: [id number]  HPI Chief Complaint  Patient presents with  . Foot Pain    Rt foot is swollen, painful, and have tightness feeling.      Review of Systems  Musculoskeletal: Positive for back pain.  All other systems reviewed and are negative.      Objective:   Physical Exam        Assessment & Plan:

## 2017-01-02 NOTE — Progress Notes (Signed)
Subjective:    Patient ID: Ashley Lamb, female   DOB: 76 y.o.   MRN: [id number]   HPI patient presents with quite a bit of pain in the right ankle and lateral foot and states that it's been swelling and been hurting her for around 3 months    Review of Systems  All other systems reviewed and are negative.       Objective:  Physical Exam  Cardiovascular: Intact distal pulses.   Musculoskeletal: Normal range of motion.  Neurological: She is alert.  Skin: Skin is warm.  Nursing note and vitals reviewed.  neurovascular status found to be intact muscle strength was adequate range of motion within normal limits with patient found to have exquisite tenderness in the capsule of the sinus tarsi right with tenderness in a more dorsal distal direction and +1 pitting edema. Negative Homan sign was noted and patient's noted to have good digital perfusion and is well oriented 3     Assessment:    Sinus tarsitis right with inflammatory changes lateral foot     Plan:    H&P and condition reviewed and today I injected the sinus tarsi right 3 mg Kenalog 5 mg Xylocaine and I dispensed anklet with instructions on compression. Reappoint for [place] to recheck again in the next several weeks  X-rays were negative for signs of fracture or other bone pathology

## 2017-01-11 DIAGNOSIS — W57XXXA Bitten or stung by nonvenomous insect and other nonvenomous arthropods, initial encounter: Secondary | ICD-10-CM | POA: Diagnosis not present

## 2017-01-11 DIAGNOSIS — S30860A Insect bite (nonvenomous) of lower back and pelvis, initial encounter: Secondary | ICD-10-CM | POA: Diagnosis not present

## 2017-01-13 ENCOUNTER — Ambulatory Visit: Payer: Medicare Other | Admitting: Podiatry

## 2017-01-23 ENCOUNTER — Encounter: Payer: Self-pay | Admitting: Podiatry

## 2017-01-23 ENCOUNTER — Ambulatory Visit (INDEPENDENT_AMBULATORY_CARE_PROVIDER_SITE_OTHER): Payer: Medicare Other | Admitting: Podiatry

## 2017-01-23 DIAGNOSIS — M779 Enthesopathy, unspecified: Secondary | ICD-10-CM

## 2017-01-23 NOTE — Progress Notes (Signed)
Subjective:    Patient ID: Ashley Lamb, female   DOB: 76 y.o.   MRN: [id number]   HPI patient states my right foot is feeling quite a bit better    ROS      Objective:  Physical Exam neurovascular status intact negative Homans sign was noted with patient's right foot showing inflammation that's improved with still slight discomfort upon palpation     Assessment:    Improving capsulitis of the right foot     Plan:    Advised on anti-inflammatories physical therapy supportive shoes and if symptoms recur patient will be seen back and I educated her on all of this today

## 2017-04-10 DIAGNOSIS — M1711 Unilateral primary osteoarthritis, right knee: Secondary | ICD-10-CM | POA: Diagnosis not present

## 2017-04-10 DIAGNOSIS — M5441 Lumbago with sciatica, right side: Secondary | ICD-10-CM | POA: Diagnosis not present

## 2017-05-02 DIAGNOSIS — Z23 Encounter for immunization: Secondary | ICD-10-CM | POA: Diagnosis not present

## 2017-05-10 DIAGNOSIS — M545 Low back pain: Secondary | ICD-10-CM | POA: Diagnosis not present

## 2017-05-10 DIAGNOSIS — M1711 Unilateral primary osteoarthritis, right knee: Secondary | ICD-10-CM | POA: Diagnosis not present

## 2017-06-19 DIAGNOSIS — N951 Menopausal and female climacteric states: Secondary | ICD-10-CM | POA: Diagnosis not present

## 2017-06-19 DIAGNOSIS — E559 Vitamin D deficiency, unspecified: Secondary | ICD-10-CM | POA: Diagnosis not present

## 2017-06-19 DIAGNOSIS — E669 Obesity, unspecified: Secondary | ICD-10-CM | POA: Diagnosis not present

## 2017-06-19 DIAGNOSIS — M150 Primary generalized (osteo)arthritis: Secondary | ICD-10-CM | POA: Diagnosis not present

## 2017-06-19 DIAGNOSIS — I10 Essential (primary) hypertension: Secondary | ICD-10-CM | POA: Diagnosis not present

## 2017-06-19 DIAGNOSIS — M6740 Ganglion, unspecified site: Secondary | ICD-10-CM | POA: Diagnosis not present

## 2017-08-17 ENCOUNTER — Other Ambulatory Visit: Payer: Self-pay | Admitting: Family Medicine

## 2017-08-17 DIAGNOSIS — Z139 Encounter for screening, unspecified: Secondary | ICD-10-CM

## 2017-08-21 ENCOUNTER — Ambulatory Visit
Admission: RE | Admit: 2017-08-21 | Discharge: 2017-08-21 | Disposition: A | Discharge status: Home / Self Care | Payer: Medicare Other | Source: Ambulatory Visit | Attending: Family Medicine | Admitting: Family Medicine

## 2017-08-21 DIAGNOSIS — Z139 Encounter for screening, unspecified: Secondary | ICD-10-CM

## 2017-08-21 DIAGNOSIS — Z1231 Encounter for screening mammogram for malignant neoplasm of breast: Secondary | ICD-10-CM | POA: Diagnosis not present

## 2017-09-19 DIAGNOSIS — J069 Acute upper respiratory infection, unspecified: Secondary | ICD-10-CM | POA: Diagnosis not present

## 2017-11-13 ENCOUNTER — Ambulatory Visit
Admission: RE | Admit: 2017-11-13 | Discharge: 2017-11-13 | Disposition: A | Discharge status: Home / Self Care | Payer: Medicare HMO | Source: Ambulatory Visit | Attending: Family Medicine | Admitting: Family Medicine

## 2017-11-13 ENCOUNTER — Other Ambulatory Visit: Payer: Self-pay | Admitting: Family Medicine

## 2017-11-13 DIAGNOSIS — R062 Wheezing: Secondary | ICD-10-CM

## 2017-11-13 DIAGNOSIS — R918 Other nonspecific abnormal finding of lung field: Secondary | ICD-10-CM | POA: Diagnosis not present

## 2018-01-16 DIAGNOSIS — J45909 Unspecified asthma, uncomplicated: Secondary | ICD-10-CM | POA: Diagnosis not present

## 2018-01-16 DIAGNOSIS — E7800 Pure hypercholesterolemia, unspecified: Secondary | ICD-10-CM | POA: Diagnosis not present

## 2018-01-16 DIAGNOSIS — N951 Menopausal and female climacteric states: Secondary | ICD-10-CM | POA: Diagnosis not present

## 2018-01-16 DIAGNOSIS — I10 Essential (primary) hypertension: Secondary | ICD-10-CM | POA: Diagnosis not present

## 2018-01-16 DIAGNOSIS — M150 Primary generalized (osteo)arthritis: Secondary | ICD-10-CM | POA: Diagnosis not present

## 2018-01-16 DIAGNOSIS — E669 Obesity, unspecified: Secondary | ICD-10-CM | POA: Diagnosis not present

## 2018-01-16 DIAGNOSIS — E559 Vitamin D deficiency, unspecified: Secondary | ICD-10-CM | POA: Diagnosis not present

## 2018-07-23 ENCOUNTER — Other Ambulatory Visit: Payer: Self-pay | Admitting: Family Medicine

## 2018-07-23 DIAGNOSIS — Z1231 Encounter for screening mammogram for malignant neoplasm of breast: Secondary | ICD-10-CM

## 2018-08-06 DIAGNOSIS — M150 Primary generalized (osteo)arthritis: Secondary | ICD-10-CM | POA: Diagnosis not present

## 2018-08-06 DIAGNOSIS — I10 Essential (primary) hypertension: Secondary | ICD-10-CM | POA: Diagnosis not present

## 2018-08-06 DIAGNOSIS — J45909 Unspecified asthma, uncomplicated: Secondary | ICD-10-CM | POA: Diagnosis not present

## 2018-08-06 DIAGNOSIS — N951 Menopausal and female climacteric states: Secondary | ICD-10-CM | POA: Diagnosis not present

## 2018-08-06 DIAGNOSIS — E669 Obesity, unspecified: Secondary | ICD-10-CM | POA: Diagnosis not present

## 2018-08-06 DIAGNOSIS — E7800 Pure hypercholesterolemia, unspecified: Secondary | ICD-10-CM | POA: Diagnosis not present

## 2018-08-06 DIAGNOSIS — E559 Vitamin D deficiency, unspecified: Secondary | ICD-10-CM | POA: Diagnosis not present

## 2018-08-09 DIAGNOSIS — H40013 Open angle with borderline findings, low risk, bilateral: Secondary | ICD-10-CM | POA: Diagnosis not present

## 2018-08-09 DIAGNOSIS — H26493 Other secondary cataract, bilateral: Secondary | ICD-10-CM | POA: Diagnosis not present

## 2018-08-24 DIAGNOSIS — Z961 Presence of intraocular lens: Secondary | ICD-10-CM | POA: Diagnosis not present

## 2018-08-24 DIAGNOSIS — H26491 Other secondary cataract, right eye: Secondary | ICD-10-CM | POA: Diagnosis not present

## 2018-08-24 DIAGNOSIS — H40011 Open angle with borderline findings, low risk, right eye: Secondary | ICD-10-CM | POA: Diagnosis not present

## 2018-08-27 ENCOUNTER — Ambulatory Visit: Payer: Medicare HMO

## 2018-08-29 DIAGNOSIS — M67911 Unspecified disorder of synovium and tendon, right shoulder: Secondary | ICD-10-CM | POA: Diagnosis not present

## 2018-08-29 DIAGNOSIS — M25511 Pain in right shoulder: Secondary | ICD-10-CM | POA: Diagnosis not present

## 2018-08-31 DIAGNOSIS — Z961 Presence of intraocular lens: Secondary | ICD-10-CM | POA: Diagnosis not present

## 2018-08-31 DIAGNOSIS — H26492 Other secondary cataract, left eye: Secondary | ICD-10-CM | POA: Diagnosis not present

## 2018-09-05 DIAGNOSIS — M6281 Muscle weakness (generalized): Secondary | ICD-10-CM | POA: Diagnosis not present

## 2018-09-05 DIAGNOSIS — M7541 Impingement syndrome of right shoulder: Secondary | ICD-10-CM | POA: Diagnosis not present

## 2018-09-05 DIAGNOSIS — S43421D Sprain of right rotator cuff capsule, subsequent encounter: Secondary | ICD-10-CM | POA: Diagnosis not present

## 2018-09-05 DIAGNOSIS — M25611 Stiffness of right shoulder, not elsewhere classified: Secondary | ICD-10-CM | POA: Diagnosis not present

## 2018-09-06 ENCOUNTER — Emergency Department (HOSPITAL_COMMUNITY)
Admission: EM | Admit: 2018-09-06 | Discharge: 2018-09-06 | Disposition: A | Discharge status: Home / Self Care | Payer: Medicare HMO | Attending: Emergency Medicine | Admitting: Emergency Medicine

## 2018-09-06 ENCOUNTER — Emergency Department (HOSPITAL_COMMUNITY): Payer: Medicare HMO

## 2018-09-06 ENCOUNTER — Encounter (HOSPITAL_COMMUNITY): Payer: Self-pay

## 2018-09-06 ENCOUNTER — Other Ambulatory Visit: Payer: Self-pay

## 2018-09-06 DIAGNOSIS — I10 Essential (primary) hypertension: Secondary | ICD-10-CM | POA: Diagnosis not present

## 2018-09-06 DIAGNOSIS — R0789 Other chest pain: Secondary | ICD-10-CM

## 2018-09-06 DIAGNOSIS — Z79899 Other long term (current) drug therapy: Secondary | ICD-10-CM | POA: Insufficient documentation

## 2018-09-06 DIAGNOSIS — Z87891 Personal history of nicotine dependence: Secondary | ICD-10-CM | POA: Diagnosis not present

## 2018-09-06 DIAGNOSIS — R079 Chest pain, unspecified: Secondary | ICD-10-CM | POA: Diagnosis not present

## 2018-09-06 LAB — I-STAT TROPONIN, ED: TROPONIN I, POC: 0.01 ng/mL (ref 0.00–0.08)

## 2018-09-06 LAB — CBC
HCT: 34.8 % — ABNORMAL LOW (ref 36.0–46.0)
Hemoglobin: 11 g/dL — ABNORMAL LOW (ref 12.0–15.0)
MCH: 27.7 pg (ref 26.0–34.0)
MCHC: 31.6 g/dL (ref 30.0–36.0)
MCV: 87.7 fL (ref 80.0–100.0)
Platelets: 231 10*3/uL (ref 150–400)
RBC: 3.97 MIL/uL (ref 3.87–5.11)
RDW: 15.2 % (ref 11.5–15.5)
WBC: 9 10*3/uL (ref 4.0–10.5)
nRBC: 0 % (ref 0.0–0.2)

## 2018-09-06 LAB — BASIC METABOLIC PANEL
Anion gap: 13 (ref 5–15)
BUN: 16 mg/dL (ref 8–23)
CALCIUM: 8.9 mg/dL (ref 8.9–10.3)
CO2: 25 mmol/L (ref 22–32)
CREATININE: 1 mg/dL (ref 0.44–1.00)
Chloride: 103 mmol/L (ref 98–111)
GFR calc Af Amer: >60 mL/min (ref >60)
GFR calc non Af Amer: 55 mL/min — ABNORMAL LOW (ref >60)
GLUCOSE: 103 mg/dL — AB (ref 70–99)
Potassium: 3.2 mmol/L — ABNORMAL LOW (ref 3.5–5.1)
SODIUM: 141 mmol/L (ref 135–145)

## 2018-09-06 MED ORDER — METHOCARBAMOL 500 MG PO TABS: 500.0000 mg | ORAL_TABLET | Freq: Two times a day (BID) | ORAL | 0 refills | Status: AC

## 2018-09-06 MED ORDER — SODIUM CHLORIDE 0.9% FLUSH: 3.0000 mL | Freq: Once | INTRAVENOUS | Status: DC

## 2018-09-06 NOTE — ED Provider Notes (Signed)
[facility] Provider Note   CSN: [id number] Arrival date & time: 09/06/18  1329     History   Chief Complaint Chief Complaint  Patient presents with  . Chest Pain    HPI Ashley Lamb is a 78 y.o. female with history of hypertension,arthritis presenting to the emergency department today with chief complaint of chest pain.  Chest pain started approximately 12 hours ago.  She was sleeping when it started. She describes the pain as sharp located on the left side of her chest.  She admits to radiation down the left arm describing it as a dull ache. Does not happen when she is remaining still.   She did not take any medications for symptoms prior to arrival.  However she does take Tylenol daily for her arthritis.   The pain is not present when she takes a deep breath.  She denies history of similar pain.  Denies nausea, vomiting, abdominal pain, palpitations, dyspnea, diaphoresis, fever, syncope.     Past Medical History:  Diagnosis Date  . Arthritis   . History of kidney stones   . Hypertension   . Pneumonia    remote history    Patient Active Problem List   Diagnosis Date Noted  . Primary osteoarthritis of right hip 04/28/2015  . Degenerative joint disease (DJD) of hip 01/28/2014    Past Surgical History:  Procedure Laterality Date  . ABDOMINAL HYSTERECTOMY    . BACK SURGERY    . CHOLECYSTECTOMY    . COLONOSCOPY     Hx: of  . THYROIDECTOMY Right 05/17/2013   Procedure: RIGHT THYROIDECTOMY;  Surgeon: Izora Gala, MD;  Location: [facility];  Service: ENT;  Laterality: Right;  . TONSILLECTOMY    . TOTAL HIP ARTHROPLASTY Left 01/28/2014   Procedure: LEFT TOTAL HIP ARTHROPLASTY ANTERIOR APPROACH;  Surgeon: Hessie Dibble, MD;  Location: [facility];  Service: Orthopedics;  Laterality: Left;  . TOTAL HIP ARTHROPLASTY Right 04/28/2015   Procedure: TOTAL HIP ARTHROPLASTY ANTERIOR APPROACH;  Surgeon: Melrose Nakayama, MD;  Location: [facility];  Service:  Orthopedics;  Laterality: Right;     OB History   No obstetric history on file.      Home Medications    Prior to Admission medications   Medication Sig Start Date End Date Taking? Authorizing Provider  amLODipine (NORVASC) 5 MG tablet Take 5 mg by mouth daily.   Yes [provider]  Cholecalciferol (VITAMIN D) 2000 UNITS CAPS Take 2,000 Units by mouth daily.   Yes [provider]  estradiol (ESTRACE) 1 MG tablet Take 1 mg by mouth daily.   Yes [provider]  montelukast (SINGULAIR) 10 MG tablet Take 10 mg by mouth daily as needed for allergies. asthma 03/27/18  Yes [provider]  aspirin EC 325 MG EC tablet Take 1 tablet (325 mg total) by mouth 2 (two) times daily after a meal. Patient not taking: Reported on 09/06/2018 04/30/15   Loni Dolly, PA-C  methocarbamol (ROBAXIN) 500 MG tablet Take 1 tablet (500 mg total) by mouth 2 (two) times daily. 09/06/18   , Harley Hallmark, PA-C    Family History Family History  Problem Relation Age of Onset  . Hypertension Other     Social History Social History   Tobacco Use  . Smoking status: Former Smoker    Packs/day: 0.50    Years: 10.00    Pack years: 5.00    Types: Cigarettes  . Smokeless tobacco: Never Used  .  Tobacco comment: "quit many years ago can't remember when"  Substance Use Topics  . Alcohol use: No  . Drug use: No     Allergies   Patient has no known allergies.   Review of Systems Review of Systems  Constitutional: Negative for chills and fever.  HENT: Negative for congestion, sinus pressure and sore throat.   Eyes: Negative for pain and visual disturbance.  Respiratory: Negative for chest tightness and shortness of breath.   Cardiovascular: Positive for chest pain. Negative for palpitations and leg swelling.  Gastrointestinal: Negative for abdominal pain, diarrhea, nausea and vomiting.  Genitourinary: Negative for difficulty urinating and hematuria.  Musculoskeletal:  Negative for back pain and neck pain.  Skin: Negative for rash and wound.  Neurological: Negative for syncope and headaches.     Physical Exam Updated Vital Signs BP (!) 157/96   Pulse 71   Temp 98.2 F (36.8 C) (Oral)   Resp 17   SpO2 97%   Physical Exam Vitals signs and nursing note reviewed.  Constitutional:      Appearance: She is not ill-appearing or toxic-appearing.  HENT:     Head: Normocephalic and atraumatic.     Nose: Nose normal.     Mouth/Throat:     Mouth: Mucous membranes are moist.     Pharynx: Oropharynx is clear.  Eyes:     General: No scleral icterus.    Extraocular Movements: Extraocular movements intact.     Conjunctiva/sclera: Conjunctivae normal.     Pupils: Pupils are equal, round, and reactive to light.  Neck:     Musculoskeletal: Normal range of motion.     Vascular: No JVD.  Cardiovascular:     Rate and Rhythm: Normal rate and regular rhythm.     Pulses: Normal pulses.          Radial pulses are 2+ on the right side and 2+ on the left side.     Heart sounds: Normal heart sounds.  Pulmonary:     Effort: Pulmonary effort is normal.     Breath sounds: Normal breath sounds.  Abdominal:     General: There is no distension.     Palpations: Abdomen is soft.     Tenderness: There is no abdominal tenderness. There is no guarding or rebound.  Musculoskeletal: Normal range of motion.     Right lower leg: No edema.     Left lower leg: No edema.  Skin:    General: Skin is warm and dry.     Capillary Refill: Capillary refill takes less than 2 seconds.  Neurological:     Mental Status: She is alert. Mental status is at baseline.     Motor: No weakness.  Psychiatric:        Behavior: Behavior normal.      ED Treatments / Results  Labs (all labs ordered are listed, but only abnormal results are displayed) Labs Reviewed  BASIC METABOLIC PANEL - Abnormal; Notable for the following components:      Result Value   Potassium 3.2 (*)    Glucose,  Bld 103 (*)    GFR calc non Af Amer 55 (*)    All other components within normal limits  CBC - Abnormal; Notable for the following components:   Hemoglobin 11.0 (*)    HCT 34.8 (*)    All other components within normal limits  I-STAT TROPONIN, ED    EKG EKG Interpretation  Date/Time:  Thursday September 06 2018 14:18:13 EST Ventricular Rate:  78 PR Interval:    QRS Duration: 104 QT Interval:  419 QTC Calculation: 478 R Axis:   -10 Text Interpretation:  Sinus rhythm Borderline prolonged PR interval Abnormal R-wave progression, early transition Baseline wander in lead(s) V1 Confirmed by Lacretia Leigh ([id number]) on 09/06/2018 2:55:11 PM   Radiology Dg Chest 2 View  Result Date: 09/06/2018 CLINICAL DATA:  Left-sided chest pain EXAM: CHEST - 2 VIEW COMPARISON:  11/13/2017 FINDINGS: Cardiac shadow is mildly enlarged but stable. The lungs are well aerated bilaterally. Mild left basilar atelectasis is seen. Degenerative changes of the thoracic spine are noted. No other focal abnormality is noted. IMPRESSION: Mild left basilar atelectatic changes. Electronically Signed   By: Inez Catalina M.D.   On: 09/06/2018 14:10    Procedures Procedures (including critical care time)  Medications Ordered in ED Medications - No data to display   Initial Impression / Assessment and Plan / ED Course  I have reviewed the triage vital signs and the nursing notes.  Pertinent labs & imaging results that were available during my care of the patient were reviewed by me and considered in my medical decision making (see chart for details).  Patient presents to the emergency department with chest pain. Patient nontoxic appearing, in no apparent distress, vitals without significant abnormality. Fairly benign physical exam. DDX: ACS, MSK, pulmonary embolism, dissection, pneumothorax, effusion, infiltrate, arrhythmia, anemia, electrolyte derangement. Evaluation initiated with labs, EKG, and CXR. Patient on cardiac  monitor.   Work-up in the ER unremarkable. Labs reviewed, no leukocytosis, anemia, or significant electrolyte abnormality. CXR without infiltrate, effusion, pneumothorax, or fracture/dislocation.  Pain is reproducible on exam, this is likely musculoskeletal. The pain is not present at rest and is worse with arm movement.  EKG without obvious ischemia, troponin negative, doubt ACS. Patient is low risk wells, PERC negative, doubt pulmonary embolism. Pain denies a tearing sensation, symmetric pulses, no widening of mediastinum on CXR, doubt dissection. Patient has appeared hemodynamically stable throughout ER visit and appears safe for discharge with close PCP. I discussed results, treatment plan, need for PCP follow-up, and return precautions with the patient. Provided opportunity for questions, patient confirmed understanding and is in agreement with plan. Case has been discussed with and seen by Dr. Zenia Resides who agrees with the above plan to discharge.    Final Clinical Impressions(s) / ED Diagnoses   Final diagnoses:  Chest pain, muscular    ED Discharge Orders         Ordered    methocarbamol (ROBAXIN) 500 MG tablet  2 times daily     09/06/18 [address], [place] 09/07/18 0824    Lacretia Leigh, MD 09/08/18 1454

## 2018-09-06 NOTE — ED Notes (Signed)
Patient verbalizes understanding of discharge instructions. Opportunity for questioning and answers were provided. Armband removed by staff, pt discharged from ED.  

## 2018-09-06 NOTE — ED Triage Notes (Signed)
Pt reports chest pain radiating into left arm that started last night. Reports pain is sharp in nature. Denies cardiac hx. Skin warm and dry.

## 2018-09-06 NOTE — ED Provider Notes (Signed)
Medical screening examination/treatment/procedure(s) were conducted as a shared visit with non-physician practitioner(s) and myself.  I personally evaluated the patient during the encounter.  EKG Interpretation  Date/Time:  Thursday September 06 2018 14:18:13 EST Ventricular Rate:  78 PR Interval:    QRS Duration: 104 QT Interval:  419 QTC Calculation: 478 R Axis:   -10 Text Interpretation:  Sinus rhythm Borderline prolonged PR interval Abnormal R-wave progression, early transition Baseline wander in lead(s) V1 Confirmed by Lacretia Leigh ([id number]) on 09/06/2018 2:26:21 PM 78 year old female presents with left-sided chest discomfort characterizes sharp and worse with movement.  Symptoms began after she awoken overnight.  Does not happen when she is remaining still.  No associated dyspnea or diaphoresis.  On exam pain is reproducible.  Troponin EKG without acute findings.  Patient stable for discharge   Lacretia Leigh, MD 09/06/18 1514

## 2018-09-06 NOTE — Discharge Instructions (Addendum)
You have been seen today for chest pain. Please read and follow all provided instructions. Return to the emergency room for worsening condition or new concerning symptoms.    1. Medications:  Prescription sent to your pharmacy for Robaxin. It is a muscle relaxer and you can take it as needed, please take as directed. Robaxin: Robaxin is a muscle relaxer and can help relieve stiff muscles or muscle spasms.  Do not drive or perform other dangerous activities while taking the Robaxin.  Continue usual home medications Take medications as prescribed. Please review all of the medicines and only take them if you do not have an allergy to them.  2. Treatment: rest, drink plenty of fluids 3. Follow Up: Please follow up with your primary doctor in 2-5 days for discussion of your diagnoses and further evaluation after today's visit; Call today to arrange your follow up.  If you do not have a primary care doctor use the resource guide provided to find one;   It is also a possibility that you have an allergic reaction to any of the medicines that you have been prescribed - Everybody reacts differently to medications and while MOST people have no trouble with most medicines, you may have a reaction such as nausea, vomiting, rash, swelling, shortness of breath. If this is the case, please stop taking the medicine immediately and contact your physician.  ?

## 2018-09-06 NOTE — ED Notes (Signed)
ED Provider at bedside. 

## 2018-09-06 NOTE — ED Notes (Signed)
Patient transported to X-ray 

## 2018-09-10 DIAGNOSIS — M6281 Muscle weakness (generalized): Secondary | ICD-10-CM | POA: Diagnosis not present

## 2018-09-10 DIAGNOSIS — S43421D Sprain of right rotator cuff capsule, subsequent encounter: Secondary | ICD-10-CM | POA: Diagnosis not present

## 2018-09-10 DIAGNOSIS — M25611 Stiffness of right shoulder, not elsewhere classified: Secondary | ICD-10-CM | POA: Diagnosis not present

## 2018-09-10 DIAGNOSIS — M7541 Impingement syndrome of right shoulder: Secondary | ICD-10-CM | POA: Diagnosis not present

## 2018-09-14 DIAGNOSIS — S43421D Sprain of right rotator cuff capsule, subsequent encounter: Secondary | ICD-10-CM | POA: Diagnosis not present

## 2018-09-14 DIAGNOSIS — M25611 Stiffness of right shoulder, not elsewhere classified: Secondary | ICD-10-CM | POA: Diagnosis not present

## 2018-09-14 DIAGNOSIS — M7541 Impingement syndrome of right shoulder: Secondary | ICD-10-CM | POA: Diagnosis not present

## 2018-09-14 DIAGNOSIS — M6281 Muscle weakness (generalized): Secondary | ICD-10-CM | POA: Diagnosis not present

## 2018-09-17 DIAGNOSIS — S43421D Sprain of right rotator cuff capsule, subsequent encounter: Secondary | ICD-10-CM | POA: Diagnosis not present

## 2018-09-17 DIAGNOSIS — M25611 Stiffness of right shoulder, not elsewhere classified: Secondary | ICD-10-CM | POA: Diagnosis not present

## 2018-09-17 DIAGNOSIS — M7541 Impingement syndrome of right shoulder: Secondary | ICD-10-CM | POA: Diagnosis not present

## 2018-09-17 DIAGNOSIS — M6281 Muscle weakness (generalized): Secondary | ICD-10-CM | POA: Diagnosis not present

## 2018-09-19 DIAGNOSIS — M25611 Stiffness of right shoulder, not elsewhere classified: Secondary | ICD-10-CM | POA: Diagnosis not present

## 2018-09-19 DIAGNOSIS — M6281 Muscle weakness (generalized): Secondary | ICD-10-CM | POA: Diagnosis not present

## 2018-09-19 DIAGNOSIS — S43421D Sprain of right rotator cuff capsule, subsequent encounter: Secondary | ICD-10-CM | POA: Diagnosis not present

## 2018-09-19 DIAGNOSIS — M7541 Impingement syndrome of right shoulder: Secondary | ICD-10-CM | POA: Diagnosis not present

## 2018-09-26 DIAGNOSIS — M6281 Muscle weakness (generalized): Secondary | ICD-10-CM | POA: Diagnosis not present

## 2018-09-26 DIAGNOSIS — M7541 Impingement syndrome of right shoulder: Secondary | ICD-10-CM | POA: Diagnosis not present

## 2018-09-26 DIAGNOSIS — M25611 Stiffness of right shoulder, not elsewhere classified: Secondary | ICD-10-CM | POA: Diagnosis not present

## 2018-09-26 DIAGNOSIS — S43421D Sprain of right rotator cuff capsule, subsequent encounter: Secondary | ICD-10-CM | POA: Diagnosis not present

## 2018-09-28 DIAGNOSIS — M25611 Stiffness of right shoulder, not elsewhere classified: Secondary | ICD-10-CM | POA: Diagnosis not present

## 2018-09-28 DIAGNOSIS — M7541 Impingement syndrome of right shoulder: Secondary | ICD-10-CM | POA: Diagnosis not present

## 2018-09-28 DIAGNOSIS — S43421D Sprain of right rotator cuff capsule, subsequent encounter: Secondary | ICD-10-CM | POA: Diagnosis not present

## 2018-09-28 DIAGNOSIS — M6281 Muscle weakness (generalized): Secondary | ICD-10-CM | POA: Diagnosis not present

## 2018-10-02 DIAGNOSIS — M7541 Impingement syndrome of right shoulder: Secondary | ICD-10-CM | POA: Diagnosis not present

## 2018-10-02 DIAGNOSIS — S43421D Sprain of right rotator cuff capsule, subsequent encounter: Secondary | ICD-10-CM | POA: Diagnosis not present

## 2018-10-02 DIAGNOSIS — M6281 Muscle weakness (generalized): Secondary | ICD-10-CM | POA: Diagnosis not present

## 2018-10-02 DIAGNOSIS — M25611 Stiffness of right shoulder, not elsewhere classified: Secondary | ICD-10-CM | POA: Diagnosis not present

## 2018-10-04 DIAGNOSIS — M25611 Stiffness of right shoulder, not elsewhere classified: Secondary | ICD-10-CM | POA: Diagnosis not present

## 2018-10-04 DIAGNOSIS — M6281 Muscle weakness (generalized): Secondary | ICD-10-CM | POA: Diagnosis not present

## 2018-10-04 DIAGNOSIS — M7541 Impingement syndrome of right shoulder: Secondary | ICD-10-CM | POA: Diagnosis not present

## 2018-10-04 DIAGNOSIS — S43421D Sprain of right rotator cuff capsule, subsequent encounter: Secondary | ICD-10-CM | POA: Diagnosis not present

## 2018-10-08 DIAGNOSIS — M25611 Stiffness of right shoulder, not elsewhere classified: Secondary | ICD-10-CM | POA: Diagnosis not present

## 2018-10-08 DIAGNOSIS — M7541 Impingement syndrome of right shoulder: Secondary | ICD-10-CM | POA: Diagnosis not present

## 2018-10-08 DIAGNOSIS — S43421D Sprain of right rotator cuff capsule, subsequent encounter: Secondary | ICD-10-CM | POA: Diagnosis not present

## 2018-10-08 DIAGNOSIS — M6281 Muscle weakness (generalized): Secondary | ICD-10-CM | POA: Diagnosis not present

## 2018-10-12 DIAGNOSIS — S43421D Sprain of right rotator cuff capsule, subsequent encounter: Secondary | ICD-10-CM | POA: Diagnosis not present

## 2018-10-12 DIAGNOSIS — M25611 Stiffness of right shoulder, not elsewhere classified: Secondary | ICD-10-CM | POA: Diagnosis not present

## 2018-10-12 DIAGNOSIS — M6281 Muscle weakness (generalized): Secondary | ICD-10-CM | POA: Diagnosis not present

## 2018-10-12 DIAGNOSIS — M7541 Impingement syndrome of right shoulder: Secondary | ICD-10-CM | POA: Diagnosis not present

## 2018-10-31 ENCOUNTER — Inpatient Hospital Stay: Admission: RE | Admit: 2018-10-31 | Payer: Medicare HMO | Source: Ambulatory Visit

## 2018-12-04 ENCOUNTER — Ambulatory Visit: Payer: Medicare HMO

## 2018-12-26 DIAGNOSIS — M542 Cervicalgia: Secondary | ICD-10-CM | POA: Diagnosis not present

## 2019-02-05 DIAGNOSIS — M150 Primary generalized (osteo)arthritis: Secondary | ICD-10-CM | POA: Diagnosis not present

## 2019-02-05 DIAGNOSIS — I10 Essential (primary) hypertension: Secondary | ICD-10-CM | POA: Diagnosis not present

## 2019-02-05 DIAGNOSIS — E7800 Pure hypercholesterolemia, unspecified: Secondary | ICD-10-CM | POA: Diagnosis not present

## 2019-02-05 DIAGNOSIS — N951 Menopausal and female climacteric states: Secondary | ICD-10-CM | POA: Diagnosis not present

## 2019-02-05 DIAGNOSIS — J45909 Unspecified asthma, uncomplicated: Secondary | ICD-10-CM | POA: Diagnosis not present

## 2019-02-05 DIAGNOSIS — E559 Vitamin D deficiency, unspecified: Secondary | ICD-10-CM | POA: Diagnosis not present

## 2019-02-05 DIAGNOSIS — E669 Obesity, unspecified: Secondary | ICD-10-CM | POA: Diagnosis not present

## 2019-02-11 DIAGNOSIS — E7800 Pure hypercholesterolemia, unspecified: Secondary | ICD-10-CM | POA: Diagnosis not present

## 2019-02-11 DIAGNOSIS — I10 Essential (primary) hypertension: Secondary | ICD-10-CM | POA: Diagnosis not present

## 2019-03-27 DIAGNOSIS — R0981 Nasal congestion: Secondary | ICD-10-CM | POA: Diagnosis not present

## 2019-06-04 ENCOUNTER — Ambulatory Visit
Admission: RE | Admit: 2019-06-04 | Discharge: 2019-06-04 | Disposition: A | Discharge status: Home / Self Care | Payer: Medicare HMO | Source: Ambulatory Visit | Attending: Family Medicine | Admitting: Family Medicine

## 2019-06-04 ENCOUNTER — Other Ambulatory Visit: Payer: Self-pay

## 2019-06-04 DIAGNOSIS — Z1231 Encounter for screening mammogram for malignant neoplasm of breast: Secondary | ICD-10-CM

## 2019-08-12 DIAGNOSIS — I10 Essential (primary) hypertension: Secondary | ICD-10-CM | POA: Diagnosis not present

## 2019-08-12 DIAGNOSIS — J45909 Unspecified asthma, uncomplicated: Secondary | ICD-10-CM | POA: Diagnosis not present

## 2019-08-12 DIAGNOSIS — E669 Obesity, unspecified: Secondary | ICD-10-CM | POA: Diagnosis not present

## 2019-08-12 DIAGNOSIS — M150 Primary generalized (osteo)arthritis: Secondary | ICD-10-CM | POA: Diagnosis not present

## 2019-08-12 DIAGNOSIS — E7800 Pure hypercholesterolemia, unspecified: Secondary | ICD-10-CM | POA: Diagnosis not present

## 2019-08-12 DIAGNOSIS — N951 Menopausal and female climacteric states: Secondary | ICD-10-CM | POA: Diagnosis not present

## 2019-08-12 DIAGNOSIS — E559 Vitamin D deficiency, unspecified: Secondary | ICD-10-CM | POA: Diagnosis not present

## 2019-08-16 DIAGNOSIS — I10 Essential (primary) hypertension: Secondary | ICD-10-CM | POA: Diagnosis not present

## 2020-02-11 DIAGNOSIS — R490 Dysphonia: Secondary | ICD-10-CM | POA: Diagnosis not present

## 2020-02-11 DIAGNOSIS — E7800 Pure hypercholesterolemia, unspecified: Secondary | ICD-10-CM | POA: Diagnosis not present

## 2020-02-11 DIAGNOSIS — E559 Vitamin D deficiency, unspecified: Secondary | ICD-10-CM | POA: Diagnosis not present

## 2020-02-11 DIAGNOSIS — J45909 Unspecified asthma, uncomplicated: Secondary | ICD-10-CM | POA: Diagnosis not present

## 2020-02-11 DIAGNOSIS — E669 Obesity, unspecified: Secondary | ICD-10-CM | POA: Diagnosis not present

## 2020-02-11 DIAGNOSIS — N951 Menopausal and female climacteric states: Secondary | ICD-10-CM | POA: Diagnosis not present

## 2020-02-11 DIAGNOSIS — I10 Essential (primary) hypertension: Secondary | ICD-10-CM | POA: Diagnosis not present

## 2020-02-11 DIAGNOSIS — M150 Primary generalized (osteo)arthritis: Secondary | ICD-10-CM | POA: Diagnosis not present

## 2020-02-12 DIAGNOSIS — I10 Essential (primary) hypertension: Secondary | ICD-10-CM | POA: Diagnosis not present

## 2020-05-21 ENCOUNTER — Other Ambulatory Visit: Payer: Self-pay | Admitting: Family Medicine

## 2020-05-21 DIAGNOSIS — Z1231 Encounter for screening mammogram for malignant neoplasm of breast: Secondary | ICD-10-CM

## 2020-06-19 ENCOUNTER — Ambulatory Visit: Payer: Medicare HMO

## 2020-06-24 ENCOUNTER — Other Ambulatory Visit: Payer: Self-pay

## 2020-06-24 ENCOUNTER — Ambulatory Visit
Admission: RE | Admit: 2020-06-24 | Discharge: 2020-06-24 | Disposition: A | Discharge status: Home / Self Care | Payer: Medicare HMO | Source: Ambulatory Visit | Attending: Family Medicine | Admitting: Family Medicine

## 2020-06-24 DIAGNOSIS — Z1231 Encounter for screening mammogram for malignant neoplasm of breast: Secondary | ICD-10-CM | POA: Diagnosis not present

## 2020-08-15 DIAGNOSIS — H524 Presbyopia: Secondary | ICD-10-CM | POA: Diagnosis not present

## 2020-09-04 DIAGNOSIS — M150 Primary generalized (osteo)arthritis: Secondary | ICD-10-CM | POA: Diagnosis not present

## 2020-09-04 DIAGNOSIS — E7800 Pure hypercholesterolemia, unspecified: Secondary | ICD-10-CM | POA: Diagnosis not present

## 2020-09-04 DIAGNOSIS — E559 Vitamin D deficiency, unspecified: Secondary | ICD-10-CM | POA: Diagnosis not present

## 2020-09-04 DIAGNOSIS — N951 Menopausal and female climacteric states: Secondary | ICD-10-CM | POA: Diagnosis not present

## 2020-09-04 DIAGNOSIS — I10 Essential (primary) hypertension: Secondary | ICD-10-CM | POA: Diagnosis not present

## 2020-09-04 DIAGNOSIS — J45909 Unspecified asthma, uncomplicated: Secondary | ICD-10-CM | POA: Diagnosis not present

## 2020-10-17 DIAGNOSIS — H348312 Tributary (branch) retinal vein occlusion, right eye, stable: Secondary | ICD-10-CM | POA: Diagnosis not present

## 2021-01-01 DIAGNOSIS — Z20822 Contact with and (suspected) exposure to covid-19: Secondary | ICD-10-CM | POA: Diagnosis not present

## 2021-01-11 DIAGNOSIS — Z03818 Encounter for observation for suspected exposure to other biological agents ruled out: Secondary | ICD-10-CM | POA: Diagnosis not present

## 2021-04-17 DIAGNOSIS — H348312 Tributary (branch) retinal vein occlusion, right eye, stable: Secondary | ICD-10-CM | POA: Diagnosis not present

## 2021-05-31 ENCOUNTER — Other Ambulatory Visit: Payer: Self-pay | Admitting: Family Medicine

## 2021-05-31 DIAGNOSIS — Z1231 Encounter for screening mammogram for malignant neoplasm of breast: Secondary | ICD-10-CM

## 2021-07-09 ENCOUNTER — Ambulatory Visit
Admission: RE | Admit: 2021-07-09 | Discharge: 2021-07-09 | Disposition: A | Discharge status: Home / Self Care | Payer: Medicare HMO | Source: Ambulatory Visit | Attending: Family Medicine | Admitting: Family Medicine

## 2021-07-09 ENCOUNTER — Other Ambulatory Visit: Payer: Self-pay

## 2021-07-09 DIAGNOSIS — Z1231 Encounter for screening mammogram for malignant neoplasm of breast: Secondary | ICD-10-CM

## 2021-10-14 DIAGNOSIS — H34231 Retinal artery branch occlusion, right eye: Secondary | ICD-10-CM | POA: Diagnosis not present

## 2021-10-14 DIAGNOSIS — Z961 Presence of intraocular lens: Secondary | ICD-10-CM | POA: Diagnosis not present

## 2021-10-14 DIAGNOSIS — H43811 Vitreous degeneration, right eye: Secondary | ICD-10-CM | POA: Diagnosis not present

## 2021-10-14 DIAGNOSIS — H547 Unspecified visual loss: Secondary | ICD-10-CM | POA: Diagnosis not present

## 2021-11-03 DIAGNOSIS — H43813 Vitreous degeneration, bilateral: Secondary | ICD-10-CM | POA: Diagnosis not present

## 2021-11-03 DIAGNOSIS — H348112 Central retinal vein occlusion, right eye, stable: Secondary | ICD-10-CM | POA: Diagnosis not present

## 2021-11-03 DIAGNOSIS — H43822 Vitreomacular adhesion, left eye: Secondary | ICD-10-CM | POA: Diagnosis not present

## 2021-11-11 DIAGNOSIS — I10 Essential (primary) hypertension: Secondary | ICD-10-CM | POA: Diagnosis not present

## 2021-11-11 DIAGNOSIS — M150 Primary generalized (osteo)arthritis: Secondary | ICD-10-CM | POA: Diagnosis not present

## 2021-11-11 DIAGNOSIS — J45909 Unspecified asthma, uncomplicated: Secondary | ICD-10-CM | POA: Diagnosis not present

## 2021-11-11 DIAGNOSIS — Z23 Encounter for immunization: Secondary | ICD-10-CM | POA: Diagnosis not present

## 2021-11-11 DIAGNOSIS — E559 Vitamin D deficiency, unspecified: Secondary | ICD-10-CM | POA: Diagnosis not present

## 2021-11-11 DIAGNOSIS — Z0000 Encounter for general adult medical examination without abnormal findings: Secondary | ICD-10-CM | POA: Diagnosis not present

## 2021-11-11 DIAGNOSIS — N951 Menopausal and female climacteric states: Secondary | ICD-10-CM | POA: Diagnosis not present

## 2021-11-12 DIAGNOSIS — I10 Essential (primary) hypertension: Secondary | ICD-10-CM | POA: Diagnosis not present

## 2022-05-17 DIAGNOSIS — N951 Menopausal and female climacteric states: Secondary | ICD-10-CM | POA: Diagnosis not present

## 2022-05-17 DIAGNOSIS — E7800 Pure hypercholesterolemia, unspecified: Secondary | ICD-10-CM | POA: Diagnosis not present

## 2022-05-17 DIAGNOSIS — J45909 Unspecified asthma, uncomplicated: Secondary | ICD-10-CM | POA: Diagnosis not present

## 2022-05-17 DIAGNOSIS — M150 Primary generalized (osteo)arthritis: Secondary | ICD-10-CM | POA: Diagnosis not present

## 2022-05-17 DIAGNOSIS — E559 Vitamin D deficiency, unspecified: Secondary | ICD-10-CM | POA: Diagnosis not present

## 2022-05-17 DIAGNOSIS — I10 Essential (primary) hypertension: Secondary | ICD-10-CM | POA: Diagnosis not present

## 2022-08-24 ENCOUNTER — Other Ambulatory Visit: Payer: Self-pay | Admitting: Family Medicine

## 2022-08-24 DIAGNOSIS — Z1231 Encounter for screening mammogram for malignant neoplasm of breast: Secondary | ICD-10-CM

## 2022-10-13 ENCOUNTER — Ambulatory Visit
Admission: RE | Admit: 2022-10-13 | Discharge: 2022-10-13 | Disposition: A | Discharge status: Home / Self Care | Payer: Medicare HMO | Source: Ambulatory Visit | Attending: Family Medicine | Admitting: Family Medicine

## 2022-10-13 DIAGNOSIS — Z1231 Encounter for screening mammogram for malignant neoplasm of breast: Secondary | ICD-10-CM | POA: Diagnosis not present

## 2022-10-18 ENCOUNTER — Other Ambulatory Visit: Payer: Self-pay | Admitting: Family Medicine

## 2022-10-18 DIAGNOSIS — R928 Other abnormal and inconclusive findings on diagnostic imaging of breast: Secondary | ICD-10-CM

## 2022-10-31 ENCOUNTER — Ambulatory Visit
Admission: RE | Admit: 2022-10-31 | Discharge: 2022-10-31 | Disposition: A | Discharge status: Home / Self Care | Payer: Medicare HMO | Source: Ambulatory Visit | Attending: Family Medicine | Admitting: Family Medicine

## 2022-10-31 DIAGNOSIS — R928 Other abnormal and inconclusive findings on diagnostic imaging of breast: Secondary | ICD-10-CM

## 2022-10-31 DIAGNOSIS — N6002 Solitary cyst of left breast: Secondary | ICD-10-CM | POA: Diagnosis not present

## 2022-11-03 DIAGNOSIS — H34231 Retinal artery branch occlusion, right eye: Secondary | ICD-10-CM | POA: Diagnosis not present

## 2022-11-03 DIAGNOSIS — Z961 Presence of intraocular lens: Secondary | ICD-10-CM | POA: Diagnosis not present

## 2022-11-03 DIAGNOSIS — H43822 Vitreomacular adhesion, left eye: Secondary | ICD-10-CM | POA: Diagnosis not present

## 2022-11-03 DIAGNOSIS — H40012 Open angle with borderline findings, low risk, left eye: Secondary | ICD-10-CM | POA: Diagnosis not present

## 2022-11-04 DIAGNOSIS — Z0100 Encounter for examination of eyes and vision without abnormal findings: Secondary | ICD-10-CM | POA: Diagnosis not present

## 2022-11-18 ENCOUNTER — Other Ambulatory Visit: Payer: Self-pay | Admitting: Internal Medicine

## 2022-11-18 DIAGNOSIS — I10 Essential (primary) hypertension: Secondary | ICD-10-CM | POA: Diagnosis not present

## 2022-11-18 DIAGNOSIS — N951 Menopausal and female climacteric states: Secondary | ICD-10-CM | POA: Diagnosis not present

## 2022-11-18 DIAGNOSIS — E2839 Other primary ovarian failure: Secondary | ICD-10-CM

## 2022-11-18 DIAGNOSIS — E559 Vitamin D deficiency, unspecified: Secondary | ICD-10-CM | POA: Diagnosis not present

## 2022-11-18 DIAGNOSIS — Z13820 Encounter for screening for osteoporosis: Secondary | ICD-10-CM | POA: Diagnosis not present

## 2022-11-18 DIAGNOSIS — N1832 Chronic kidney disease, stage 3b: Secondary | ICD-10-CM | POA: Diagnosis not present

## 2022-11-18 DIAGNOSIS — Z0000 Encounter for general adult medical examination without abnormal findings: Secondary | ICD-10-CM | POA: Diagnosis not present

## 2022-11-18 DIAGNOSIS — M150 Primary generalized (osteo)arthritis: Secondary | ICD-10-CM | POA: Diagnosis not present

## 2022-11-18 DIAGNOSIS — E7800 Pure hypercholesterolemia, unspecified: Secondary | ICD-10-CM | POA: Diagnosis not present

## 2022-11-18 DIAGNOSIS — R634 Abnormal weight loss: Secondary | ICD-10-CM | POA: Diagnosis not present

## 2022-11-18 DIAGNOSIS — H40012 Open angle with borderline findings, low risk, left eye: Secondary | ICD-10-CM | POA: Diagnosis not present

## 2023-02-03 DIAGNOSIS — R413 Other amnesia: Secondary | ICD-10-CM | POA: Diagnosis not present

## 2023-02-17 DIAGNOSIS — H43822 Vitreomacular adhesion, left eye: Secondary | ICD-10-CM | POA: Diagnosis not present

## 2023-02-17 DIAGNOSIS — H348312 Tributary (branch) retinal vein occlusion, right eye, stable: Secondary | ICD-10-CM | POA: Diagnosis not present

## 2023-02-17 DIAGNOSIS — H35372 Puckering of macula, left eye: Secondary | ICD-10-CM | POA: Diagnosis not present

## 2023-02-17 DIAGNOSIS — H40013 Open angle with borderline findings, low risk, bilateral: Secondary | ICD-10-CM | POA: Diagnosis not present

## 2023-04-27 DIAGNOSIS — Z23 Encounter for immunization: Secondary | ICD-10-CM | POA: Diagnosis not present

## 2023-05-26 DIAGNOSIS — R413 Other amnesia: Secondary | ICD-10-CM | POA: Diagnosis not present

## 2023-05-26 DIAGNOSIS — M150 Primary generalized (osteo)arthritis: Secondary | ICD-10-CM | POA: Diagnosis not present

## 2023-05-26 DIAGNOSIS — N951 Menopausal and female climacteric states: Secondary | ICD-10-CM | POA: Diagnosis not present

## 2023-05-26 DIAGNOSIS — I10 Essential (primary) hypertension: Secondary | ICD-10-CM | POA: Diagnosis not present

## 2023-05-26 DIAGNOSIS — N184 Chronic kidney disease, stage 4 (severe): Secondary | ICD-10-CM | POA: Diagnosis not present

## 2023-05-26 DIAGNOSIS — R200 Anesthesia of skin: Secondary | ICD-10-CM | POA: Diagnosis not present

## 2023-05-26 DIAGNOSIS — J45909 Unspecified asthma, uncomplicated: Secondary | ICD-10-CM | POA: Diagnosis not present

## 2023-05-31 ENCOUNTER — Inpatient Hospital Stay
Admission: RE | Admit: 2023-05-31 | Discharge: 2023-05-31 | Disposition: A | Discharge status: Home / Self Care | Payer: Medicare HMO | Source: Ambulatory Visit | Attending: Internal Medicine

## 2023-05-31 DIAGNOSIS — N958 Other specified menopausal and perimenopausal disorders: Secondary | ICD-10-CM | POA: Diagnosis not present

## 2023-05-31 DIAGNOSIS — E2839 Other primary ovarian failure: Secondary | ICD-10-CM | POA: Diagnosis not present

## 2023-05-31 DIAGNOSIS — Z13820 Encounter for screening for osteoporosis: Secondary | ICD-10-CM

## 2023-06-28 DIAGNOSIS — R946 Abnormal results of thyroid function studies: Secondary | ICD-10-CM | POA: Diagnosis not present

## 2023-06-30 DIAGNOSIS — E559 Vitamin D deficiency, unspecified: Secondary | ICD-10-CM | POA: Diagnosis not present

## 2023-06-30 DIAGNOSIS — D631 Anemia in chronic kidney disease: Secondary | ICD-10-CM | POA: Diagnosis not present

## 2023-06-30 DIAGNOSIS — I129 Hypertensive chronic kidney disease with stage 1 through stage 4 chronic kidney disease, or unspecified chronic kidney disease: Secondary | ICD-10-CM | POA: Diagnosis not present

## 2023-06-30 DIAGNOSIS — N184 Chronic kidney disease, stage 4 (severe): Secondary | ICD-10-CM | POA: Diagnosis not present

## 2023-07-03 ENCOUNTER — Other Ambulatory Visit: Payer: Self-pay | Admitting: Nephrology

## 2023-07-03 DIAGNOSIS — E559 Vitamin D deficiency, unspecified: Secondary | ICD-10-CM

## 2023-07-03 DIAGNOSIS — N189 Chronic kidney disease, unspecified: Secondary | ICD-10-CM

## 2023-07-03 DIAGNOSIS — N184 Chronic kidney disease, stage 4 (severe): Secondary | ICD-10-CM

## 2023-07-07 ENCOUNTER — Ambulatory Visit
Admission: RE | Admit: 2023-07-07 | Discharge: 2023-07-07 | Disposition: A | Discharge status: Home / Self Care | Payer: Medicare HMO | Source: Ambulatory Visit | Attending: Nephrology | Admitting: Nephrology

## 2023-07-07 DIAGNOSIS — N189 Chronic kidney disease, unspecified: Secondary | ICD-10-CM

## 2023-07-07 DIAGNOSIS — N184 Chronic kidney disease, stage 4 (severe): Secondary | ICD-10-CM

## 2023-07-07 DIAGNOSIS — N200 Calculus of kidney: Secondary | ICD-10-CM | POA: Diagnosis not present

## 2023-07-07 DIAGNOSIS — E559 Vitamin D deficiency, unspecified: Secondary | ICD-10-CM

## 2023-10-30 ENCOUNTER — Other Ambulatory Visit: Payer: Self-pay | Admitting: Internal Medicine

## 2023-10-30 DIAGNOSIS — Z1231 Encounter for screening mammogram for malignant neoplasm of breast: Secondary | ICD-10-CM

## 2023-11-15 ENCOUNTER — Ambulatory Visit
Admission: RE | Admit: 2023-11-15 | Discharge: 2023-11-15 | Disposition: A | Discharge status: Home / Self Care | Source: Ambulatory Visit | Attending: Internal Medicine | Admitting: Internal Medicine

## 2023-11-15 DIAGNOSIS — Z1231 Encounter for screening mammogram for malignant neoplasm of breast: Secondary | ICD-10-CM

## 2024-04-10 ENCOUNTER — Emergency Department (HOSPITAL_BASED_OUTPATIENT_CLINIC_OR_DEPARTMENT_OTHER)

## 2024-04-10 ENCOUNTER — Emergency Department (HOSPITAL_BASED_OUTPATIENT_CLINIC_OR_DEPARTMENT_OTHER)
Admission: EM | Admit: 2024-04-10 | Discharge: 2024-04-10 | Disposition: A | Discharge status: Home / Self Care | Attending: Emergency Medicine | Admitting: Emergency Medicine

## 2024-04-10 ENCOUNTER — Encounter (HOSPITAL_BASED_OUTPATIENT_CLINIC_OR_DEPARTMENT_OTHER): Payer: Self-pay | Admitting: Emergency Medicine

## 2024-04-10 ENCOUNTER — Other Ambulatory Visit: Payer: Self-pay

## 2024-04-10 DIAGNOSIS — I10 Essential (primary) hypertension: Secondary | ICD-10-CM | POA: Diagnosis not present

## 2024-04-10 DIAGNOSIS — S79912A Unspecified injury of left hip, initial encounter: Secondary | ICD-10-CM | POA: Diagnosis present

## 2024-04-10 DIAGNOSIS — Z7982 Long term (current) use of aspirin: Secondary | ICD-10-CM | POA: Insufficient documentation

## 2024-04-10 DIAGNOSIS — Y92002 Bathroom of unspecified non-institutional (private) residence single-family (private) house as the place of occurrence of the external cause: Secondary | ICD-10-CM | POA: Diagnosis not present

## 2024-04-10 DIAGNOSIS — S7002XA Contusion of left hip, initial encounter: Secondary | ICD-10-CM | POA: Diagnosis not present

## 2024-04-10 DIAGNOSIS — Z79899 Other long term (current) drug therapy: Secondary | ICD-10-CM | POA: Insufficient documentation

## 2024-04-10 DIAGNOSIS — W010XXA Fall on same level from slipping, tripping and stumbling without subsequent striking against object, initial encounter: Secondary | ICD-10-CM | POA: Insufficient documentation

## 2024-04-10 NOTE — ED Triage Notes (Signed)
 Fell in bathroom trying adjust shower curtain with foot. Landed on left hip. No head strike. No thinners. Left hip pain. Knee hurt-chronic-no worse than normal. Ambulatory after fall and in triage.

## 2024-04-10 NOTE — ED Provider Notes (Signed)
 [place] EMERGENCY DEPARTMENT AT [facility] Provider Note   CSN: [id number] Arrival date & time: 04/10/24  1612     Patient presents with: Ashley Lamb is a 83 y.o. female.    Fall     Patient has a history of hypertension arthritis pneumonia.  Ashley Lamb presents ED for evaluation of hip pain.  Patient states Ashley Lamb was trying dressed the shower curtain with her foot.  Ashley Lamb excellently slipped and fell onto her left hip.  Patient did not hit her head.  Ashley Lamb is not having any pain elsewhere.  Patient has been able to walk and bear weight since the fall.  Ashley Lamb is having some soreness on the lateral aspect of her left hip.  Prior to Admission medications   Medication Sig Start Date End Date Taking? Authorizing Provider  amLODipine  (NORVASC ) 5 MG tablet Take 5 mg by mouth daily.    [provider]  aspirin  EC 325 MG EC tablet Take 1 tablet (325 mg total) by mouth 2 (two) times daily after a meal. Patient not taking: Reported on 09/06/2018 04/30/15   Lenis Barter, PA-C  Cholecalciferol  (VITAMIN D ) 2000 UNITS CAPS Take 2,000 Units by mouth daily.    [provider]  estradiol  (ESTRACE ) 1 MG tablet Take 1 mg by mouth daily.    [provider]  methocarbamol  (ROBAXIN ) 500 MG tablet Take 1 tablet (500 mg total) by mouth 2 (two) times daily. 09/06/18   Walisiewicz, Kaitlyn E, PA-C  montelukast (SINGULAIR) 10 MG tablet Take 10 mg by mouth daily as needed for allergies. asthma 03/27/18   [provider]    Allergies: Patient has no known allergies.    Review of Systems  Updated Vital Signs BP (!) 152/87   Pulse 74   SpO2 97%   Physical Exam Vitals and nursing note reviewed.  Constitutional:      General: Ashley Lamb is not in acute distress.    Appearance: Ashley Lamb is well-developed.  HENT:     Head: Normocephalic and atraumatic.     Right Ear: External ear normal.     Left Ear: External ear normal.  Eyes:     General: No scleral icterus.       Right  eye: No discharge.        Left eye: No discharge.     Conjunctiva/sclera: Conjunctivae normal.  Neck:     Trachea: No tracheal deviation.  Cardiovascular:     Rate and Rhythm: Normal rate.  Pulmonary:     Effort: Pulmonary effort is normal. No respiratory distress.     Breath sounds: No stridor.  Abdominal:     General: There is no distension.  Musculoskeletal:        General: Tenderness present. No swelling or deformity.     Cervical back: Neck supple.     Lumbar back: No tenderness.     Left hip: Tenderness and bony tenderness present.  Skin:    General: Skin is warm and dry.     Findings: No rash.  Neurological:     Mental Status: Ashley Lamb is alert. Mental status is at baseline.     Cranial Nerves: No dysarthria or facial asymmetry.     Motor: No seizure activity.     (all labs ordered are listed, but only abnormal results are displayed) Labs Reviewed - No data to display  EKG: None  Radiology: No results found.   Procedures   Medications Ordered in the ED - No  data to display  Clinical Course as of 04/10/24 1738  Wed Apr 10, 2024  1731 X-ray without acute abnormality. [JK]    Clinical Course User Index [JK] Randol Simmonds, MD                                 Medical Decision Making Problems Addressed: Contusion of left hip, initial encounter: acute illness or injury that poses a threat to life or bodily functions  Amount and/or Complexity of Data Reviewed Radiology: ordered and independent interpretation performed.   Patient's x-rays do not show any signs of acute fracture.  Prosthesis is intact.  Patient is able to ambulate.  Suspect contusion but doubt occult fracture     Final diagnoses:  None    ED Discharge Orders     None          Randol Simmonds, MD 04/10/24 [phone]

## 2024-04-10 NOTE — ED Provider Notes (Incomplete Revision)
 [facility] EMERGENCY DEPARTMENT AT [facility] Provider Note   CSN: [id number] Arrival date & time: 04/10/24  1612     Patient presents with: Ashley Lamb is a 83 y.o. female.  {Add pertinent medical, surgical, social history, OB history to YEP:67052}  Fall     Patient has a history of hypertension arthritis pneumonia.  She presents ED for evaluation of hip pain.  Patient states she was trying dressed the shower curtain with her foot.  She excellently slipped and fell onto her left hip.  Patient did not hit her head.  She is not having any pain elsewhere.  Patient has been able to walk and bear weight since the fall.  She is having some soreness on the lateral aspect of her left hip.  Prior to Admission medications   Medication Sig Start Date End Date Taking? Authorizing Provider  amLODipine  (NORVASC ) 5 MG tablet Take 5 mg by mouth daily.    [provider]  aspirin  EC 325 MG EC tablet Take 1 tablet (325 mg total) by mouth 2 (two) times daily after a meal. Patient not taking: Reported on 09/06/2018 04/30/15   Lenis Barter, PA-C  Cholecalciferol  (VITAMIN D ) 2000 UNITS CAPS Take 2,000 Units by mouth daily.    [provider]  estradiol  (ESTRACE ) 1 MG tablet Take 1 mg by mouth daily.    [provider]  methocarbamol  (ROBAXIN ) 500 MG tablet Take 1 tablet (500 mg total) by mouth 2 (two) times daily. 09/06/18   Walisiewicz, Kaitlyn E, PA-C  montelukast (SINGULAIR) 10 MG tablet Take 10 mg by mouth daily as needed for allergies. asthma 03/27/18   [provider]    Allergies: Patient has no known allergies.    Review of Systems  Updated Vital Signs BP (!) 152/87   Pulse 74   SpO2 97%   Physical Exam Vitals and nursing note reviewed.  Constitutional:      General: She is not in acute distress.    Appearance: She is well-developed.  HENT:     Head: Normocephalic and atraumatic.     Right Ear: External ear normal.     Left Ear:  External ear normal.  Eyes:     General: No scleral icterus.       Right eye: No discharge.        Left eye: No discharge.     Conjunctiva/sclera: Conjunctivae normal.  Neck:     Trachea: No tracheal deviation.  Cardiovascular:     Rate and Rhythm: Normal rate.  Pulmonary:     Effort: Pulmonary effort is normal. No respiratory distress.     Breath sounds: No stridor.  Abdominal:     General: There is no distension.  Musculoskeletal:        General: Tenderness present. No swelling or deformity.     Cervical back: Neck supple.     Lumbar back: No tenderness.     Left hip: Tenderness and bony tenderness present.  Skin:    General: Skin is warm and dry.     Findings: No rash.  Neurological:     Mental Status: She is alert. Mental status is at baseline.     Cranial Nerves: No dysarthria or facial asymmetry.     Motor: No seizure activity.     (all labs ordered are listed, but only abnormal results are displayed) Labs Reviewed - No data to display  EKG: None  Radiology: DG Hip Unilat W or Wo  Pelvis 2-3 Views Left Result Date: 04/10/2024 CLINICAL DATA:  Fall, left hip pain EXAM: DG HIP (WITH OR WITHOUT PELVIS) 2-3V LEFT COMPARISON:  None Available. FINDINGS: Bilateral hip replacements. No hardware complicating feature. No acute bony abnormality. Specifically, no fracture, subluxation, or dislocation. IMPRESSION: No acute bony abnormality. Electronically Signed   By: Franky Crease M.D.   On: 04/10/2024 17:13    {Document cardiac monitor, telemetry assessment procedure when appropriate:32947} Procedures   Medications Ordered in the ED - No data to display  Clinical Course as of 04/10/24 1739  Wed Apr 10, 2024  1731 X-ray without acute abnormality. [JK]    Clinical Course User Index [JK] Randol Simmonds, MD   {Click here for ABCD2, HEART and other calculators REFRESH Note before signing:1}                              Medical Decision Making Problems Addressed: Contusion of  left hip, initial encounter: acute illness or injury that poses a threat to life or bodily functions  Amount and/or Complexity of Data Reviewed Radiology: ordered and independent interpretation performed.   Patient's x-rays do not show any signs of acute fracture.  Prosthesis is intact.  Patient is able to ambulate.  Suspect contusion but doubt occult fracture  {Document critical care time when appropriate  Document review of labs and clinical decision tools ie CHADS2VASC2, etc  Document your independent review of radiology images and any outside records  Document your discussion with family members, caretakers and with consultants  Document social determinants of health affecting pt's care  Document your decision making why or why not admission, treatments were needed:32947:::1}   Final diagnoses:  Contusion of left hip, initial encounter    ED Discharge Orders     None          Randol Simmonds, MD 04/10/24 1738

## 2024-04-10 NOTE — ED Notes (Signed)
 DC paperwork given and verbally understood.

## 2024-04-10 NOTE — Discharge Instructions (Signed)
 You can take over-the-counter medications such as Tylenol  to help with aches and pains.  You can also apply ice and apply topical lidocaine  as needed.  Follow-up with your orthopedic doctor to recheck if the symptoms persist.

## 2024-04-12 ENCOUNTER — Emergency Department (HOSPITAL_BASED_OUTPATIENT_CLINIC_OR_DEPARTMENT_OTHER)
Admission: EM | Admit: 2024-04-12 | Discharge: 2024-04-12 | Disposition: A | Discharge status: Home / Self Care | Attending: Emergency Medicine | Admitting: Emergency Medicine

## 2024-04-12 ENCOUNTER — Emergency Department (HOSPITAL_BASED_OUTPATIENT_CLINIC_OR_DEPARTMENT_OTHER)

## 2024-04-12 ENCOUNTER — Other Ambulatory Visit: Payer: Self-pay

## 2024-04-12 DIAGNOSIS — M25572 Pain in left ankle and joints of left foot: Secondary | ICD-10-CM | POA: Diagnosis present

## 2024-04-12 DIAGNOSIS — Z7982 Long term (current) use of aspirin: Secondary | ICD-10-CM | POA: Insufficient documentation

## 2024-04-12 NOTE — ED Provider Notes (Signed)
 [place] EMERGENCY DEPARTMENT AT [facility] Provider Note   CSN: [id number] Arrival date & time: 04/12/24  1030     Patient presents with: Ankle Pain   Ashley Lamb is a 83 y.o. female.   83 year old female with a past medical history of high blood pressure presents to the ED status post mechanical fall which occurred approximately 2 days ago.  Patient was evaluated on September 10 after a fall.  Reports she has had some pain along her left hip.  However, left ankle began to hurt as well.  Exacerbated with any type of weightbearing, alleviated by rest.  Has not taken any medication for improvement of symptoms.  Denies any other complaints reported.  The history is provided by the patient.  Ankle Pain Associated symptoms: no fever        Prior to Admission medications   Medication Sig Start Date End Date Taking? Authorizing Provider  amLODipine  (NORVASC ) 5 MG tablet Take 5 mg by mouth daily.    [provider]  aspirin  EC 325 MG EC tablet Take 1 tablet (325 mg total) by mouth 2 (two) times daily after a meal. Patient not taking: Reported on 09/06/2018 04/30/15   Lenis Barter, PA-C  Cholecalciferol  (VITAMIN D ) 2000 UNITS CAPS Take 2,000 Units by mouth daily.    [provider]  estradiol  (ESTRACE ) 1 MG tablet Take 1 mg by mouth daily.    [provider]  methocarbamol  (ROBAXIN ) 500 MG tablet Take 1 tablet (500 mg total) by mouth 2 (two) times daily. 09/06/18   Walisiewicz, Kaitlyn E, PA-C  montelukast (SINGULAIR) 10 MG tablet Take 10 mg by mouth daily as needed for allergies. asthma 03/27/18   [provider]    Allergies: Patient has no known allergies.    Review of Systems  Constitutional:  Negative for fever.  Musculoskeletal:  Positive for arthralgias.    Updated Vital Signs BP 121/76 (BP Location: Right Arm)   Pulse 65   Temp 98.2 F (36.8 C)   Resp 18   Ht 5' 9 (1.753 m)   Wt 73 kg   SpO2 97%   BMI 23.78 kg/m    Physical Exam Vitals and nursing note reviewed.  Constitutional:      Appearance: Normal appearance.  HENT:     Head: Normocephalic and atraumatic.     Mouth/Throat:     Mouth: Mucous membranes are moist.  Cardiovascular:     Rate and Rhythm: Normal rate.  Pulmonary:     Effort: Pulmonary effort is normal.  Abdominal:     General: Abdomen is flat.  Musculoskeletal:     Cervical back: Normal range of motion and neck supple.     Left ankle: Tenderness present over the lateral malleolus.     Left Achilles Tendon: Normal.     Comments: 2+ DP, PT pulses.  Tenderness to palpation on the medial malleolus.  Good flexion with plantarflexion and dorsiflexion.  Skin:    General: Skin is warm and dry.  Neurological:     Mental Status: She is alert and oriented to person, place, and time.     (all labs ordered are listed, but only abnormal results are displayed) Labs Reviewed - No data to display  EKG: None  Radiology: DG Ankle Complete Left Result Date: 04/12/2024 CLINICAL DATA:  Status post fall, pain EXAM: LEFT ANKLE COMPLETE - 3+ VIEW COMPARISON:  None Available. FINDINGS: There is no evidence of fracture, dislocation, or joint effusion. There is  no evidence of arthropathy or other focal bone abnormality. Mild soft tissue swelling around the ankle joint. Mild degenerative changes of the anterior tibiotalar joint. Tibiotalar slant is normal. IMPRESSION: No fracture or dislocation. Mild periarticular soft tissue swelling. Osteoarthrosis of the tibiotalar joint. Electronically Signed   By: Megan  Zare M.D.   On: 04/12/2024 11:38   DG Hip Unilat W or Wo Pelvis 2-3 Views Left Result Date: 04/10/2024 CLINICAL DATA:  Fall, left hip pain EXAM: DG HIP (WITH OR WITHOUT PELVIS) 2-3V LEFT COMPARISON:  None Available. FINDINGS: Bilateral hip replacements. No hardware complicating feature. No acute bony abnormality. Specifically, no fracture, subluxation, or dislocation. IMPRESSION: No acute bony  abnormality. Electronically Signed   By: Franky Crease M.D.   On: 04/10/2024 17:13     Procedures   Medications Ordered in the ED - No data to display                                  Medical Decision Making Amount and/or Complexity of Data Reviewed Radiology: ordered.    Patient presented the ED with left ankle pain status post mechanical fall which occurred 2 days ago.  Has not taken anything improvement in symptoms, however did for the symptoms do feel better with rest.  Vitals are within normal limits.  There is no further trauma occurred.  She does have a exam, is neurovascularly intact.  X-ray did not show any acute fracture or dislocation, some mild swelling reported.  We discussed brace versus watch and wait.  Patient will follow-up with PCP as needed.  We discussed RICE therapy.  Hemodynamically stable for discharge.    Portions of this note were generated with Scientist, clinical (histocompatibility and immunogenetics). Dictation errors may occur despite best attempts at proofreading.   Final diagnoses:  Acute left ankle pain    ED Discharge Orders     None          Maureen Broad, PA-C 04/12/24 1154    Ruthe Cornet, DO 04/12/24 1215

## 2024-04-12 NOTE — Discharge Instructions (Addendum)
 The x-ray of your ankle did not show any acute fracture.  Please continue to elevate it, ice it, and the anti-inflammatories to help with your pain.

## 2024-04-12 NOTE — ED Triage Notes (Signed)
 Pt caox4 c/o L ankle pain. Pt was in ED 9/10 for fall but states the ankle didn't start hurting until yesterday. No pain at rest, only when bearing weight.

## 2024-05-06 ENCOUNTER — Emergency Department (HOSPITAL_BASED_OUTPATIENT_CLINIC_OR_DEPARTMENT_OTHER): Admitting: Radiology

## 2024-05-06 ENCOUNTER — Encounter (HOSPITAL_BASED_OUTPATIENT_CLINIC_OR_DEPARTMENT_OTHER): Payer: Self-pay | Admitting: Urology

## 2024-05-06 ENCOUNTER — Emergency Department (HOSPITAL_BASED_OUTPATIENT_CLINIC_OR_DEPARTMENT_OTHER)
Admission: EM | Admit: 2024-05-06 | Discharge: 2024-05-06 | Disposition: A | Discharge status: Home / Self Care | Attending: Emergency Medicine | Admitting: Emergency Medicine

## 2024-05-06 ENCOUNTER — Other Ambulatory Visit: Payer: Self-pay

## 2024-05-06 DIAGNOSIS — I10 Essential (primary) hypertension: Secondary | ICD-10-CM | POA: Insufficient documentation

## 2024-05-06 DIAGNOSIS — Z79899 Other long term (current) drug therapy: Secondary | ICD-10-CM | POA: Insufficient documentation

## 2024-05-06 DIAGNOSIS — Z87442 Personal history of urinary calculi: Secondary | ICD-10-CM | POA: Insufficient documentation

## 2024-05-06 DIAGNOSIS — M25562 Pain in left knee: Secondary | ICD-10-CM | POA: Insufficient documentation

## 2024-05-06 DIAGNOSIS — Z96643 Presence of artificial hip joint, bilateral: Secondary | ICD-10-CM | POA: Insufficient documentation

## 2024-05-06 MED ORDER — ACETAMINOPHEN 500 MG PO TABS
1000.0000 mg | ORAL_TABLET | Freq: Once | ORAL | Status: AC
  Administered 2024-05-06: 1000 mg via ORAL
  Filled 2024-05-06: qty 2

## 2024-05-06 NOTE — ED Triage Notes (Signed)
 Pt states left knee pain that started 3 hours ago  Pain worse with weight bearing  Denies any known injury

## 2024-05-06 NOTE — Discharge Instructions (Signed)
 You were evaluated in the emergency room for knee injury.  You were found to hav significant arthritis in your knee.  You were fitted with a brace.  You may wear this for support.  You may alternate Tylenol  and ibuprofen  and use ice for pain.  Please call the number on the sheet to follow-up with orthopedics.

## 2024-05-06 NOTE — ED Notes (Addendum)
 Patient ambulated in room with knee brace with steady gait.

## 2024-05-06 NOTE — ED Provider Notes (Signed)
 [place] EMERGENCY DEPARTMENT AT [facility] Provider Note   CSN: [id number] Arrival date & time: 05/06/24  1731     Patient presents with: Knee Pain   Ashley Lamb is a 83 y.o. female with history of arthritis presents with complaints of left knee pain.  Patient states that she bumped her her knee on the dresser 3 hours ago. She states that she is able to move her leg but she has pain with weightbearing.  Denies any numbness or tingling.  No prior surgeries to the affected extremity.    Knee Pain     Past Medical History:  Diagnosis Date  . Arthritis   . History of kidney stones   . Hypertension   . Pneumonia    remote history   Past Surgical History:  Procedure Laterality Date  . ABDOMINAL HYSTERECTOMY    . BACK SURGERY    . BREAST BIOPSY Left    1980's  . CHOLECYSTECTOMY    . COLONOSCOPY     Hx: of  . THYROIDECTOMY Right 05/17/2013   Procedure: RIGHT THYROIDECTOMY;  Surgeon: Ida Loader, MD;  Location: [facility] OR;  Service: ENT;  Laterality: Right;  . TONSILLECTOMY    . TOTAL HIP ARTHROPLASTY Left 01/28/2014   Procedure: LEFT TOTAL HIP ARTHROPLASTY ANTERIOR APPROACH;  Surgeon: Maude KANDICE Herald, MD;  Location: [facility] OR;  Service: Orthopedics;  Laterality: Left;  . TOTAL HIP ARTHROPLASTY Right 04/28/2015   Procedure: TOTAL HIP ARTHROPLASTY ANTERIOR APPROACH;  Surgeon: Maude Herald, MD;  Location: [facility] OR;  Service: Orthopedics;  Laterality: Right;     Prior to Admission medications   Medication Sig Start Date End Date Taking? Authorizing Provider  amLODipine  (NORVASC ) 5 MG tablet Take 5 mg by mouth daily.    [provider]  aspirin  EC 325 MG EC tablet Take 1 tablet (325 mg total) by mouth 2 (two) times daily after a meal. Patient not taking: Reported on 09/06/2018 04/30/15   Lenis Barter, PA-C  Cholecalciferol  (VITAMIN D ) 2000 UNITS CAPS Take 2,000 Units by mouth daily.    [provider]  estradiol  (ESTRACE ) 1 MG tablet Take 1 mg by mouth  daily.    [provider]  methocarbamol  (ROBAXIN ) 500 MG tablet Take 1 tablet (500 mg total) by mouth 2 (two) times daily. 09/06/18   Walisiewicz, Kaitlyn E, PA-C  montelukast (SINGULAIR) 10 MG tablet Take 10 mg by mouth daily as needed for allergies. asthma 03/27/18   [provider]    Allergies: Patient has no known allergies.    Review of Systems  Musculoskeletal:  Positive for arthralgias.    Updated Vital Signs BP 137/84   Pulse 69   Temp 97.6 F (36.4 C) (Oral)   Resp 16   Ht 5' 9 (1.753 m)   Wt 73 kg   SpO2 95%   BMI 23.77 kg/m   Physical Exam Vitals and nursing note reviewed.  Constitutional:      General: She is not in acute distress.    Appearance: She is well-developed.  HENT:     Head: Normocephalic and atraumatic.  Eyes:     Conjunctiva/sclera: Conjunctivae normal.  Cardiovascular:     Rate and Rhythm: Normal rate and regular rhythm.     Heart sounds: No murmur heard. Pulmonary:     Effort: Pulmonary effort is normal. No respiratory distress.     Breath sounds: Normal breath sounds.  Abdominal:     Palpations: Abdomen is soft.  Tenderness: There is no abdominal tenderness.  Musculoskeletal:        General: No swelling.     Cervical back: Neck supple.     Comments: No focal tenderness, swelling, ecchymosis or gross deformities noted to the extremity.  Compartments are soft, tolerates full range of motion.  Knee is stable to varus and valgus.  DP/PT pulses are 2+  Skin:    General: Skin is warm and dry.     Capillary Refill: Capillary refill takes less than 2 seconds.  Neurological:     Mental Status: She is alert.  Psychiatric:        Mood and Affect: Mood normal.     (all labs ordered are listed, but only abnormal results are displayed) Labs Reviewed - No data to display  EKG: None  Radiology: DG Knee Complete 4 Views Left Result Date: 05/06/2024 CLINICAL DATA:  Left knee pain. EXAM: LEFT KNEE - COMPLETE 4+ VIEW  COMPARISON:  None Available. FINDINGS: Severe tricompartmental degenerative changes with marked joint space narrowing osteophytic spurring and bony eburnation. There are also changes of chondrocalcinosis and possible loose ossified bodies in the joint. No acute fracture or osteochondral lesion. No obvious joint effusion. IMPRESSION: 1. Severe tricompartmental degenerative changes. 2. No acute bony findings. Electronically Signed   By: MYRTIS Stammer M.D.   On: 05/06/2024 18:47     Procedures   Medications Ordered in the ED  acetaminophen  (TYLENOL ) tablet 1,000 mg (has no administration in time range)    Clinical Course as of 05/06/24 1939  Mon May 06, 2024  1802 Patient with history of arthritis evaluated for left knee pain, started after bumping her knee on the dresser.  She is hemodynamically stable.  Her exam is entirely benign.  Given her significant discomfort with ambulation will obtain x-rays. [JT]  1858 DG Knee Complete 4 Views Left Severe degenerative changes [JT]  1904 Workup notable for arthritis.  Patient will be fitted with a brace.  She is ambulatory.  Instructed on RICE.  Will provide Ortho follow-up. [JT]    Clinical Course User Index [JT] Donnajean Lynwood DEL, PA-C                                 Medical Decision Making  This patient presents to the ED with chief complaint(s) of knee pain.  The complaint involves an extensive differential diagnosis and also carries with it a high risk of complications and morbidity.   Pertinent past medical history as listed in HPI  The differential diagnosis includes  No clinical suspicion for septic joint or gout. Additional history obtained: Additional history obtained from family Records reviewed Care Everywhere/External Records  Disposition:   Patient will be discharged home. The patient has been appropriately medically screened and/or stabilized in the ED. I have low suspicion for any other emergent medical condition which would  require further screening, evaluation or treatment in the ED or require inpatient management. At time of discharge the patient is hemodynamically stable and in no acute distress. I have discussed work-up results and diagnosis with patient and answered all questions. Patient is agreeable with discharge plan. We discussed strict return precautions for returning to the emergency department and they verbalized understanding.     Social Determinants of Health:   none  This note was dictated with voice recognition software.  Despite best efforts at proofreading, errors may have occurred which can change the documentation meaning.  Final diagnoses:  Acute pain of left knee    ED Discharge Orders     None          Donnajean Lynwood VEAR DEVONNA 05/06/24 1940    Lenor Hollering, MD 05/07/24 0001

## 2024-06-04 ENCOUNTER — Other Ambulatory Visit: Payer: Self-pay

## 2024-06-04 ENCOUNTER — Encounter: Payer: Self-pay | Admitting: Family Medicine

## 2024-06-04 ENCOUNTER — Ambulatory Visit: Admitting: Family Medicine

## 2024-06-04 ENCOUNTER — Ambulatory Visit

## 2024-06-04 VITALS — BP 120/82 | HR 72 | Ht 69.0 in | Wt 148.0 lb

## 2024-06-04 DIAGNOSIS — N951 Menopausal and female climacteric states: Secondary | ICD-10-CM | POA: Insufficient documentation

## 2024-06-04 DIAGNOSIS — E7800 Pure hypercholesterolemia, unspecified: Secondary | ICD-10-CM | POA: Insufficient documentation

## 2024-06-04 DIAGNOSIS — M170 Bilateral primary osteoarthritis of knee: Secondary | ICD-10-CM | POA: Diagnosis not present

## 2024-06-04 DIAGNOSIS — M25561 Pain in right knee: Secondary | ICD-10-CM

## 2024-06-04 DIAGNOSIS — G8929 Other chronic pain: Secondary | ICD-10-CM | POA: Diagnosis not present

## 2024-06-04 DIAGNOSIS — E559 Vitamin D deficiency, unspecified: Secondary | ICD-10-CM | POA: Insufficient documentation

## 2024-06-04 DIAGNOSIS — M25562 Pain in left knee: Secondary | ICD-10-CM

## 2024-06-04 DIAGNOSIS — N184 Chronic kidney disease, stage 4 (severe): Secondary | ICD-10-CM | POA: Insufficient documentation

## 2024-06-04 DIAGNOSIS — I10 Essential (primary) hypertension: Secondary | ICD-10-CM | POA: Insufficient documentation

## 2024-06-04 DIAGNOSIS — E039 Hypothyroidism, unspecified: Secondary | ICD-10-CM | POA: Insufficient documentation

## 2024-06-04 DIAGNOSIS — R413 Other amnesia: Secondary | ICD-10-CM | POA: Insufficient documentation

## 2024-06-04 DIAGNOSIS — H35039 Hypertensive retinopathy, unspecified eye: Secondary | ICD-10-CM | POA: Insufficient documentation

## 2024-06-04 DIAGNOSIS — M179 Osteoarthritis of knee, unspecified: Secondary | ICD-10-CM | POA: Insufficient documentation

## 2024-06-04 NOTE — Patient Instructions (Addendum)
 Thank you for coming in today.   You received an injection today. Seek immediate medical attention if the joint becomes red, extremely painful, or is oozing fluid.   Please get an Xray today before you leave   See you back as needed.

## 2024-06-04 NOTE — Progress Notes (Signed)
 I, Leotis Batter, CMA acting as a scribe for Artist Lloyd, MD.  Ashley Lamb is a 83 y.o. female who presents to [facility] at [facility] today for bilat knee pain x 1 month. Pt was seen at the [facility] ED on 10/6 after bumping her L knee on a dresser. Pt locates pain to lateral aspect of the knees. Causing night disturbance. Short-term relief with ice and Tylenol .   Knee swelling: intermittently Mechanical symptoms: present bilaterally Aggravates: sit-to-stand Treatments tried: Tylenol   Dx testing: 05/06/24 L knee XR  Pertinent review of systems: No fever or chills  Relevant historical information: DJD, memory impairment stage IV chronic kidney disease.   Exam:  BP 120/82   Pulse 72   Ht 5' 9 (1.753 m)   Wt 148 lb (67.1 kg)   SpO2 97%   BMI 21.86 kg/m  General: Well Developed, well nourished, and in no acute distress.   MSK: Bilateral knees moderate effusion normal-appearing otherwise. Decreased range of motion.  Intact strength. Gait a bit unsteady.    Lab and Radiology Results  Procedure: Real-time Ultrasound Guided Injection of left knee joint superior lateral patella space Device: Philips Affiniti 50G/GE Logiq Images permanently stored and available for review in PACS Verbal informed consent obtained.  Discussed risks and benefits of procedure. Warned about infection, bleeding, hyperglycemia damage to structures among others. Patient expresses understanding and agreement Time-out conducted.   Noted no overlying erythema, induration, or other signs of local infection.   Skin prepped in a sterile fashion.   Local anesthesia: Topical Ethyl chloride.   With sterile technique and under real time ultrasound guidance: 40 mg of Depo-Medrol  and 2 mL of Marcaine injected into knee joint. Fluid seen entering the joint capsule.   Completed without difficulty   Pain immediately resolved suggesting accurate placement of the medication.   Advised to call  if fevers/chills, erythema, induration, drainage, or persistent bleeding.   Images permanently stored and available for review in the ultrasound unit.  Impression: Technically successful ultrasound guided injection.   Procedure: Real-time Ultrasound Guided Injection of right knee joint superior lateral patella space Device: Philips Affiniti 50G/GE Logiq Images permanently stored and available for review in PACS Verbal informed consent obtained.  Discussed risks and benefits of procedure. Warned about infection, bleeding, hyperglycemia damage to structures among others. Patient expresses understanding and agreement Time-out conducted.   Noted no overlying erythema, induration, or other signs of local infection.   Skin prepped in a sterile fashion.   Local anesthesia: Topical Ethyl chloride.   With sterile technique and under real time ultrasound guidance: 40 mg of Depo-Medrol  and 2 mL of Marcaine injected into knee joint. Fluid seen entering the joint capsule.   Completed without difficulty   Pain immediately resolved suggesting accurate placement of the medication.   Advised to call if fevers/chills, erythema, induration, drainage, or persistent bleeding.   Images permanently stored and available for review in the ultrasound unit.  Impression: Technically successful ultrasound guided injection.   X-ray images right knee obtained today personally and independently interpreted. Severe DJD.  No acute fractures. Await formal radiology review   EXAM: LEFT KNEE - COMPLETE 4+ VIEW   COMPARISON:  None Available.   FINDINGS: Severe tricompartmental degenerative changes with marked joint space narrowing osteophytic spurring and bony eburnation. There are also changes of chondrocalcinosis and possible loose ossified bodies in the joint. No acute fracture or osteochondral lesion. No obvious joint effusion.   IMPRESSION: 1. Severe tricompartmental degenerative  changes. 2. No acute bony  findings.     Electronically Signed   By: MYRTIS Stammer M.D.   On: 05/06/2024 18:47    Assessment and Plan: 83 y.o. female with bilateral knee pain due to significant DJD.  Plan for steroid injection both knees today.  Patient is already engaged in home health physical therapy which should be helpful.  She is not going to be a good surgical candidate for total knee replacement given her cognitive and memory impairment.  Plan for steroid injection today.  If this does not last long enough consider gel or Zilretta  injections.  If ultimately more definitive treatment needed she may be a good candidate for genicular artery embolization.  Check back as needed.   PDMP not reviewed this encounter. Orders Placed This Encounter  Procedures   US  LIMITED JOINT SPACE STRUCTURES LOW BILAT(NO LINKED CHARGES)    Reason for Exam (SYMPTOM  OR DIAGNOSIS REQUIRED):   bilat knee pain    Preferred imaging location?:   [facility] Knee AP/LAT W/[place] Right    Standing Status:   Future    Number of Occurrences:   1    Expiration Date:   06/04/2025    Reason for Exam (SYMPTOM  OR DIAGNOSIS REQUIRED):   eval right knee    Preferred imaging location?:   [facility] [place]   No orders of the defined types were placed in this encounter.    Discussed warning signs or symptoms. Please see discharge instructions. Patient expresses understanding.   The above documentation has been reviewed and is accurate and complete Artist Lloyd, M.D.

## 2024-06-10 ENCOUNTER — Ambulatory Visit: Payer: Self-pay | Admitting: Family Medicine

## 2024-06-10 NOTE — Progress Notes (Signed)
Right knee x-ray shows severe arthritis

## 2024-06-11 NOTE — Progress Notes (Signed)
 Pt view results via mychart.

## 2024-07-24 ENCOUNTER — Telehealth: Payer: Self-pay

## 2024-07-24 ENCOUNTER — Ambulatory Visit: Admitting: Family Medicine

## 2024-07-24 ENCOUNTER — Other Ambulatory Visit: Payer: Self-pay

## 2024-07-24 ENCOUNTER — Ambulatory Visit

## 2024-07-24 VITALS — BP 168/100 | HR 68 | Ht 69.0 in | Wt 148.0 lb

## 2024-07-24 DIAGNOSIS — M545 Low back pain, unspecified: Secondary | ICD-10-CM

## 2024-07-24 DIAGNOSIS — M25561 Pain in right knee: Secondary | ICD-10-CM | POA: Diagnosis not present

## 2024-07-24 DIAGNOSIS — M170 Bilateral primary osteoarthritis of knee: Secondary | ICD-10-CM | POA: Diagnosis not present

## 2024-07-24 DIAGNOSIS — M25562 Pain in left knee: Secondary | ICD-10-CM

## 2024-07-24 DIAGNOSIS — G8929 Other chronic pain: Secondary | ICD-10-CM

## 2024-07-24 MED ORDER — METHYLPREDNISOLONE ACETATE 40 MG/ML IJ SUSP
40.0000 mg | Freq: Once | INTRAMUSCULAR | Status: AC
  Administered 2024-07-24: 40 mg via INTRA_ARTICULAR

## 2024-07-24 NOTE — Progress Notes (Signed)
 "        I, Ashley Collet, PhD, LAT, ATC acting as a scribe for Ashley Lloyd, MD.  [name] is a 83 y.o. female who presents to [facility] at [facility] today for cont'd bilat knee pain. Pt was last seen by Dr. Lloyd on 06/04/24 and was given bilat knee steroid injections and advised to cont home health PT.  Today, pt reports only got a couple of wks of relief from prior CSI. They are wondering about other options to try.  Additionally she notes chronic bilateral low back pain without injury.  Pain does not radiate.  Dx testing: 06/04/24 R knee XR 05/06/24 L knee XR   Pertinent review of systems: No fevers or chills  Relevant historical information: Memory deficit and stage IV chronic kidney disease.   Exam:  BP (!) 168/100   Pulse 68   Ht 5' 9 (1.753 m)   Wt 148 lb (67.1 kg)   SpO2 97%   BMI 21.86 kg/m  General: Well Developed, well nourished, and in no acute distress.   MSK: Knees bilaterally moderate effusion.  Decreased range of motion.  L-spine decreased lumbar motion nontender to palpation midline.  Lab and Radiology Results  Procedure: Real-time Ultrasound Guided Injection of right knee joint superior lateral patella space Device: Philips Affiniti 50G/GE Logiq Images permanently stored and available for review in PACS Verbal informed consent obtained.  Discussed risks and benefits of procedure. Warned about infection, bleeding, hyperglycemia damage to structures among others. Patient expresses understanding and agreement Time-out conducted.   Noted no overlying erythema, induration, or other signs of local infection.   Skin prepped in a sterile fashion.   Local anesthesia: Topical Ethyl chloride.   With sterile technique and under real time ultrasound guidance: 40 mg of Depo-Medrol  and 2 mL of Marcaine injected into knee joint. Fluid seen entering the joint capsule.   Completed without difficulty   Pain immediately resolved suggesting accurate  placement of the medication.   Advised to call if fevers/chills, erythema, induration, drainage, or persistent bleeding.   Images permanently stored and available for review in the ultrasound unit.  Impression: Technically successful ultrasound guided injection.   Procedure: Real-time Ultrasound Guided Injection of left knee joint superior lateral patella space Device: Philips Affiniti 50G/GE Logiq Images permanently stored and available for review in PACS Verbal informed consent obtained.  Discussed risks and benefits of procedure. Warned about infection, bleeding, hyperglycemia damage to structures among others. Patient expresses understanding and agreement Time-out conducted.   Noted no overlying erythema, induration, or other signs of local infection.   Skin prepped in a sterile fashion.   Local anesthesia: Topical Ethyl chloride.   With sterile technique and under real time ultrasound guidance: 40 mg of Depo-Medrol  and 2 mL of Marcaine injected into knee joint. Fluid seen entering the joint capsule.   Completed without difficulty   Pain immediately resolved suggesting accurate placement of the medication.   Advised to call if fevers/chills, erythema, induration, drainage, or persistent bleeding.   Images permanently stored and available for review in the ultrasound unit.  Impression: Technically successful ultrasound guided injection.    X-ray images lumbar spine obtained today personally and independently interpreted. Posterior lumbar fusion L3-L4 with no hardware fracture.  Degenerative changes L4-5 and L5-S1.  No acute fractures are visible. Await formal radiology review    Assessment and Plan: 83 y.o. female with bilateral knee pain due to DJD.  Plan for conventional steroid injection.  Is only been 2 months since her last steroid injection.  Will work on authorization for Zilretta .  She is not a good candidate for knee replacement.  Consider genicular artery embolization as a  possible long-term solution.  Her kidney function will be a determining factor here.  That procedure does require contrast which may be noncompatible with her renal function.  Backup plan would be radiation oncology procedure for knee pain due to arthritis.  Low back pain due to degenerative changes and some muscle spasm.  She has been doing some home health PT.  She is somewhat limited in her ability to really participate in PT better her memory deficit.  Besides Tylenol  and heat is not much left to do.  Letter sent to nephrology asking about genicular artery embolization. PDMP not reviewed this encounter. Orders Placed This Encounter  Procedures   US  LIMITED JOINT SPACE STRUCTURES LOW BILAT(NO LINKED CHARGES)    Reason for Exam (SYMPTOM  OR DIAGNOSIS REQUIRED):   bilateral knee pain    Preferred imaging location?:   [facility] Lumbar Spine 2-3 Views    Standing Status:   Future    Number of Occurrences:   1    Expiration Date:   08/24/2024    Reason for Exam (SYMPTOM  OR DIAGNOSIS REQUIRED):   low back pain    Preferred imaging location?:   [place] [employer] ordered this encounter  Medications   methylPREDNISolone  acetate (DEPO-MEDROL ) injection 40 mg   methylPREDNISolone  acetate (DEPO-MEDROL ) injection 40 mg     Discussed warning signs or symptoms. Please see discharge instructions. Patient expresses understanding.   The above documentation has been reviewed and is accurate and complete Ashley Lamb, M.D.   "

## 2024-07-24 NOTE — Patient Instructions (Addendum)
Thank you for coming in today.   Please get an Xray today before you leave   You received an injection today. Seek immediate medical attention if the joint becomes red, extremely painful, or is oozing fluid.

## 2024-07-24 NOTE — Telephone Encounter (Signed)
 Please auth Z[employer] , BILAT knees

## 2024-08-02 ENCOUNTER — Telehealth: Payer: Self-pay

## 2024-08-02 NOTE — Telephone Encounter (Signed)
 [employer]  benefits ran for bilateral knee case ID [id number]

## 2024-08-05 NOTE — Telephone Encounter (Signed)
 Had to submit a prior Auth through [employer] case ID [id number]

## 2024-08-06 NOTE — Telephone Encounter (Signed)
 [employer] asked for more information. Faxed that information to them

## 2024-08-09 ENCOUNTER — Ambulatory Visit: Payer: Self-pay | Admitting: Family Medicine

## 2024-08-09 NOTE — Telephone Encounter (Signed)
 Please schedule patient when medication is in stock  Zilretta  authorized for bilateral knee PRE CERT REQUIRED Copay $10 Deductible does not apply OOP MAX $3950 has met $0 Once OP has been met copay will no longer apply AUTHORIZATION # [id number] 08/05/24-07/31/25

## 2024-08-09 NOTE — Progress Notes (Signed)
 Lumbar spine x-ray shows that the prior back surgery at L3-L4 looks good.  There is arthritis at L2-L3 and L4-S1.  No broken bones are visible.

## 2024-08-12 ENCOUNTER — Ambulatory Visit: Admitting: Family Medicine

## 2024-08-12 ENCOUNTER — Other Ambulatory Visit: Payer: Self-pay

## 2024-08-12 ENCOUNTER — Telehealth: Payer: Self-pay | Admitting: Family Medicine

## 2024-08-12 DIAGNOSIS — M170 Bilateral primary osteoarthritis of knee: Secondary | ICD-10-CM | POA: Diagnosis not present

## 2024-08-12 DIAGNOSIS — M25561 Pain in right knee: Secondary | ICD-10-CM | POA: Diagnosis not present

## 2024-08-12 DIAGNOSIS — G8929 Other chronic pain: Secondary | ICD-10-CM | POA: Diagnosis not present

## 2024-08-12 DIAGNOSIS — M25562 Pain in left knee: Secondary | ICD-10-CM

## 2024-08-12 MED ORDER — TRIAMCINOLONE ACETONIDE 32 MG IX SRER
32.0000 mg | Freq: Once | INTRA_ARTICULAR | Status: AC
  Administered 2024-08-12: 32 mg via INTRA_ARTICULAR

## 2024-08-12 NOTE — Telephone Encounter (Signed)
 Patients daughter called and stated mother received shots in her knees on the 23rd and she is barely able to walk right now and one of her feet are swollen. FYI. Patient is scheduled for today at 1:00.

## 2024-08-12 NOTE — Progress Notes (Signed)
" °  Patient presents to clinic today for previously arranged a right knee injection.  Previous steroid injection did not help.   Zilretta  injection bilateral knee Procedure: Real-time Ultrasound Guided Injection of right knee joint superior lateral patellar space Device: Philips Affiniti 50G Images permanently stored and available for review in PACS Verbal informed consent obtained.  Discussed risks and benefits of procedure. Warned about infection, hyperglycemia bleeding, damage to structures among others. Patient expresses understanding and agreement Time-out conducted.   Noted no overlying erythema, induration, or other signs of local infection.   Skin prepped in a sterile fashion.   Local anesthesia: Topical Ethyl chloride.   With sterile technique and under real time ultrasound guidance: Zilretta  32 mg injected into knee joint. Fluid seen entering the joint capsule.   Completed without difficulty   Advised to call if fevers/chills, erythema, induration, drainage, or persistent bleeding.   Images permanently stored and available for review in the ultrasound unit.  Impression: Technically successful ultrasound guided injection.   Procedure: Real-time Ultrasound Guided Injection of left knee joint superior lateral patellar space Device: Philips Affiniti 50G Images permanently stored and available for review in PACS Verbal informed consent obtained.  Discussed risks and benefits of procedure. Warned about infection, hyperglycemia bleeding, damage to structures among others. Patient expresses understanding and agreement Time-out conducted.   Noted no overlying erythema, induration, or other signs of local infection.   Skin prepped in a sterile fashion.   Local anesthesia: Topical Ethyl chloride.   With sterile technique and under real time ultrasound guidance: Zilretta  32 mg injected into knee joint. Fluid seen entering the joint capsule.   Completed without difficulty   Advised to call  if fevers/chills, erythema, induration, drainage, or persistent bleeding.   Images permanently stored and available for review in the ultrasound unit.  Impression: Technically successful ultrasound guided injection.  Lot number: [id number]  If this does not work next up should be single dose gel shot followed by genicular artery embolization.   "

## 2024-08-12 NOTE — Patient Instructions (Signed)
 Thank you for coming in today.   Call or go to the ER if you develop a large red swollen joint with extreme pain or oozing puss.    If the zilretta  injections do not work well let me know.  Next step is gel shot.  If that does not work next step is genicular artery embolization with radiology.   Keep me updated.

## 2024-08-12 NOTE — Telephone Encounter (Signed)
 Patient scheduled for today, 1/12.
